# Patient Record
Sex: Male | Born: 1960 | Race: White | Hispanic: No | Marital: Married | Smoking: Never smoker
Health system: Southern US, Community
[De-identification: ages and names within clinical notes are randomized; demographics above are authoritative.]

## PROBLEM LIST (undated history)

## (undated) DIAGNOSIS — N400 Enlarged prostate without lower urinary tract symptoms: Secondary | ICD-10-CM

## (undated) DIAGNOSIS — K219 Gastro-esophageal reflux disease without esophagitis: Secondary | ICD-10-CM

## (undated) DIAGNOSIS — M1990 Unspecified osteoarthritis, unspecified site: Secondary | ICD-10-CM

## (undated) DIAGNOSIS — K449 Diaphragmatic hernia without obstruction or gangrene: Secondary | ICD-10-CM

## (undated) DIAGNOSIS — R5383 Other fatigue: Secondary | ICD-10-CM

## (undated) DIAGNOSIS — M2550 Pain in unspecified joint: Secondary | ICD-10-CM

## (undated) DIAGNOSIS — K429 Umbilical hernia without obstruction or gangrene: Secondary | ICD-10-CM

## (undated) DIAGNOSIS — F419 Anxiety disorder, unspecified: Secondary | ICD-10-CM

## (undated) DIAGNOSIS — K581 Irritable bowel syndrome with constipation: Secondary | ICD-10-CM

## (undated) DIAGNOSIS — R200 Anesthesia of skin: Secondary | ICD-10-CM

## (undated) HISTORY — DX: Other fatigue: R53.83

## (undated) HISTORY — DX: Umbilical hernia without obstruction or gangrene: K42.9

## (undated) HISTORY — DX: Diaphragmatic hernia without obstruction or gangrene: K44.9

## (undated) HISTORY — DX: Anxiety disorder, unspecified: F41.9

## (undated) HISTORY — DX: Irritable bowel syndrome with constipation: K58.1

## (undated) HISTORY — PX: UPPER GI ENDOSCOPY: SHX6162

## (undated) HISTORY — DX: Benign prostatic hyperplasia without lower urinary tract symptoms: N40.0

## (undated) HISTORY — DX: Anesthesia of skin: R20.0

## (undated) HISTORY — DX: Gastro-esophageal reflux disease without esophagitis: K21.9

## (undated) HISTORY — DX: Pain in unspecified joint: M25.50

## (undated) HISTORY — PX: TONSILLECTOMY: SUR1361

## (undated) HISTORY — DX: Unspecified osteoarthritis, unspecified site: M19.90

---

## 2005-12-11 ENCOUNTER — Inpatient Hospital Stay: Payer: Self-pay | Admitting: Internal Medicine

## 2005-12-11 ENCOUNTER — Other Ambulatory Visit: Payer: Self-pay

## 2006-10-24 DIAGNOSIS — N400 Enlarged prostate without lower urinary tract symptoms: Secondary | ICD-10-CM | POA: Insufficient documentation

## 2006-10-24 HISTORY — DX: Benign prostatic hyperplasia without lower urinary tract symptoms: N40.0

## 2008-12-22 ENCOUNTER — Ambulatory Visit: Payer: Self-pay | Admitting: Family Medicine

## 2009-04-13 ENCOUNTER — Ambulatory Visit: Payer: Self-pay | Admitting: Specialist

## 2009-09-02 HISTORY — PX: COLONOSCOPY: SHX174

## 2010-04-26 ENCOUNTER — Encounter: Payer: Self-pay | Admitting: Endocrinology

## 2010-06-11 ENCOUNTER — Ambulatory Visit: Payer: Self-pay | Admitting: Gastroenterology

## 2010-06-11 LAB — HM COLONOSCOPY: HM Colonoscopy: NORMAL

## 2010-07-06 ENCOUNTER — Ambulatory Visit: Payer: Self-pay | Admitting: Endocrinology

## 2010-07-06 DIAGNOSIS — E785 Hyperlipidemia, unspecified: Secondary | ICD-10-CM | POA: Insufficient documentation

## 2010-07-06 DIAGNOSIS — K219 Gastro-esophageal reflux disease without esophagitis: Secondary | ICD-10-CM

## 2010-07-06 DIAGNOSIS — E039 Hypothyroidism, unspecified: Secondary | ICD-10-CM | POA: Insufficient documentation

## 2010-07-06 HISTORY — DX: Gastro-esophageal reflux disease without esophagitis: K21.9

## 2010-10-03 NOTE — Assessment & Plan Note (Signed)
Summary: NEW ENDO CONSULT/ SELF REFERRAL/[employer]/ ELEV TSH/BRINGING RECO...   Vital Signs:  Patient profile:   50 year old male Height:      70 inches (177.80 cm) Weight:      173.38 pounds (78.81 kg) BMI:     24.97 O2 Sat:      98 % on Room air Temp:     98.9 degrees F (37.17 degrees C) oral Pulse rate:   91 / minute BP sitting:   122 / 68  (left arm) Cuff size:   regular  Vitals Entered By: Brenton Grills CMA Duncan Dull) (July 06, 2010 8:39 AM)  O2 Flow:  Room air CC: New Endo/Elevated TSH Is Patient Diabetic? No   Primary Kailan Laws:  Julieanne Manson MD  CC:  New Endo/Elevated TSH.  History of Present Illness: pt states 6 mos of mild dysphagia in the anterior neck, and assoc slight neck pain.    Current Medications (verified): 1)  Garlic 1000 Mg Caps (Garlic) .Marland Kitchen.. 1 By Mouth Once Daily 2)  Magnesium 200 Mg Tabs (Magnesium) .Marland Kitchen.. 1 By Mouth Once Daily 3)  Zinc 30 Mg Caps (Zinc) .... 2 By Mouth Once Daily 4)  Niacinamide 500 Mg Tabs (Niacinamide) .... 750 Mg Daily 5)  Vitamin D3 2400 Unit/ml Liqd (Cholecalciferol) .... 2400 International Units Daily 6)  Niacin 100 Mg Tabs (Niacin) .Marland Kitchen.. 1 By Mouth Once Daily  Allergies (verified): No Known Drug Allergies  Past History:  Past Medical History: HYPOTHYROIDISM (ICD-244.9) DYSLIPIDEMIA (ICD-272.4) GERD (ICD-530.81)  Past Surgical History: Tonsillectomy(1967)  Family History: Reviewed history and no changes required. mother has thyroid surgery, for uncertain reason.   mother also had sle  Social History: Reviewed history and no changes required. 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  Alcohol Use - no Regular Exercise - yes Smoking Status:  never Does Patient Exercise:  yes  Review of Systems  The patient denies fever.         denies depression, hair loss, cramps, sob, weight gain, blurry vision, myalgias, rash, syncope.  he reports constipation, and fatigue.  he attributes upper back pain to moving  furnitutre a few mos ago.  he has pruritis of the right eac.  he has slight numbness of the both hands, which he attributes to cts.  he has mild memory loss.     Physical Exam  General:  normal appearance.   Head:  head: no deformity eyes: no periorbital swelling, no proptosis external nose and ears are normal mouth: no lesion seen Ears:  right eac: slight dry skin Neck:  Supple without thyroid enlargement or tenderness.  Chest Wall:  nontender Lungs:  Clear to auscultation bilaterally. Normal respiratory effort.  Heart:  Regular rate and rhythm without murmurs or gallops noted. Normal S1,S2.   Abdomen:  abdomen is soft, nontender.  no hepatosplenomegaly.   not distended.  no hernia  Msk:  muscle bulk and strength are grossly normal.  no obvious joint swelling.  gait is normal and steady  Pulses:  radial are intact bilat Extremities:  no deformity Neurologic:  cn 2-12 grossly intact.   readily moves all 4's. sensation is intact to touch on all 4's Skin:  normal texture and temp.  no rash is seen.  not diaphoretic  Cervical Nodes:  No significant adenopathy.  Psych:  Alert and cooperative; normal mood and affect; normal attention span and concentration.   Additional Exam:  outside test results are reviewed:  tsh=4.89 (pt is unaware of having had any tsh  in the past)   Impression & Recommendations:  Problem # 1:  HYPOTHYROIDISM (ICD-244.9) Assessment New mild  Problem # 2:  dysphagia mild not thyroid-related  Problem # 3:  pruritis of the eac  Problem # 4:  upper back pain  Medications Added to Medication List This Visit: 1)  Garlic 1000 Mg Caps (Garlic) .Marland Kitchen.. 1 by mouth once daily 2)  Magnesium 200 Mg Tabs (Magnesium) .Marland Kitchen.. 1 by mouth once daily 3)  Zinc 30 Mg Caps (Zinc) .... 2 by mouth once daily 4)  Niacinamide 500 Mg Tabs (Niacinamide) .... 750 mg daily 5)  Vitamin D3 2400 Unit/ml Liqd (Cholecalciferol) .... 2400 international units daily 6)  Niacin 100 Mg Tabs  (Niacin) .Marland Kitchen.. 1 by mouth once daily  Other Orders: New Patient Level IV (29528)  Patient Instructions: 1)  cc dr Sullivan Lone 2)  apply hydrocortisone cream to the right ear canal as needed for itching. 3)  i am happy to order a chest x ray if you wish.   4)  i am also happy to prescribe a small amount of synthroid. 5)  if not, i would advise repeating the tsh in the spring.  you could also  have the thyroid antibodies checked, but they are likely to be positive.   6)  return here as needed.   Orders Added: 1)  New Patient Level IV [41324]    Preventive Care Screening  Last Tetanus Booster:    Date:  09/03/2003    Results:  Historical

## 2013-01-19 LAB — HM HEPATITIS C SCREENING LAB: HM HEPATITIS C SCREENING: NEGATIVE

## 2014-01-20 LAB — LIPID PANEL
CHOLESTEROL: 209 mg/dL — AB (ref 0–200)
HDL: 48 mg/dL (ref 35–70)
LDL Cholesterol: 137 mg/dL
LDL/HDL RATIO: 2.9
Triglycerides: 120 mg/dL (ref 40–160)

## 2014-01-20 LAB — CBC AND DIFFERENTIAL
HEMATOCRIT: 46 % (ref 41–53)
Hemoglobin: 15.6 g/dL (ref 13.5–17.5)
NEUTROS ABS: 60 /uL
PLATELETS: 223 10*3/uL (ref 150–399)
WBC: 5.7 10^3/mL

## 2014-01-20 LAB — HEPATIC FUNCTION PANEL
ALT: 27 U/L (ref 10–40)
AST: 24 U/L (ref 14–40)
Alkaline Phosphatase: 46 U/L (ref 25–125)
Bilirubin, Total: 1 mg/dL

## 2014-01-20 LAB — PSA: PSA: 0.7

## 2014-01-20 LAB — BASIC METABOLIC PANEL
BUN: 11 mg/dL (ref 4–21)
Creatinine: 1.1 mg/dL (ref <=1.3)
GLUCOSE: 98 mg/dL
Potassium: 5.6 mmol/L — AB (ref 3.4–5.3)
SODIUM: 140 mmol/L (ref 137–147)

## 2014-01-20 LAB — TSH: TSH: 3.92 u[IU]/mL (ref <=5.90)

## 2015-01-04 DIAGNOSIS — K581 Irritable bowel syndrome with constipation: Secondary | ICD-10-CM | POA: Insufficient documentation

## 2015-01-04 HISTORY — DX: Irritable bowel syndrome with constipation: K58.1

## 2015-02-16 ENCOUNTER — Encounter: Payer: Self-pay | Admitting: Family Medicine

## 2015-02-16 ENCOUNTER — Ambulatory Visit (INDEPENDENT_AMBULATORY_CARE_PROVIDER_SITE_OTHER): Payer: Managed Care, Other (non HMO) | Admitting: Family Medicine

## 2015-02-16 VITALS — BP 116/68 | HR 72 | Temp 98.2°F | Resp 16 | Ht 70.0 in | Wt 192.0 lb

## 2015-02-16 DIAGNOSIS — Z0000 Encounter for general adult medical examination without abnormal findings: Secondary | ICD-10-CM

## 2015-02-16 LAB — POCT URINALYSIS DIPSTICK
BILIRUBIN UA: NEGATIVE
Blood, UA: NEGATIVE
GLUCOSE UA: NEGATIVE
Ketones, UA: NEGATIVE
Leukocytes, UA: NEGATIVE
NITRITE UA: NEGATIVE
Protein, UA: NEGATIVE
SPEC GRAV UA: 1.015
UROBILINOGEN UA: 0.2
pH, UA: 6

## 2015-02-16 LAB — IFOBT (OCCULT BLOOD): IFOBT: NEGATIVE

## 2015-02-16 NOTE — Progress Notes (Signed)
Patient: Christopher Shannon, Male    DOB: 12-Feb-1961, 54 y.o.   MRN: [id number] Visit Date: 02/16/2015  Today's Provider: Wilhemena Durie, MD   Chief Complaint  Patient presents with  . Annual Exam   Subjective:   Christopher Shannon is a 54 y.o. male who presents today for  His Annual Wellness Visit. He feels well. He reports exercising once a week, he plays tennis and mows the yard. He reports he is sleeping well.  Review of Systems  Constitutional: Negative.   HENT: Negative.   Eyes: Negative.   Respiratory: Negative.   Cardiovascular: Negative.   Gastrointestinal: Positive for abdominal pain (and bloating).  Endocrine: Negative.   Genitourinary: Negative.   Musculoskeletal: Positive for back pain.  Skin: Negative.   Allergic/Immunologic: Negative.  Negative for immunocompromised state.  Hematological: Negative.   Psychiatric/Behavioral: Negative.     Patient Active Problem List   Diagnosis Date Noted  . Irritable bowel syndrome with constipation 01/04/2015  . HYPOTHYROIDISM 07/06/2010  . DYSLIPIDEMIA 07/06/2010  . GERD 07/06/2010  . Pruritic disorder 07/22/2007  . Benign prostatic hypertrophy without urinary obstruction 10/24/2006    History   Social History  . Marital Status: Married    Spouse Name: N/A  . Number of Children: N/A  . Years of Education: N/A   Occupational History  . Not on file.   Social History Main Topics  . Smoking status: Not on file  . Smokeless tobacco: Not on file  . Alcohol Use: Not on file  . Drug Use: Not on file  . Sexual Activity: Not on file   Other Topics Concern  . Not on file   Social History Narrative  . No narrative on file    No past surgical history on file.  His family history includes Heart disease in his father; Lupus in his mother; Stomach cancer in his mother; Thyroid disease in his mother.    No outpatient prescriptions prior to visit.   No facility-administered medications prior to visit.     Allergies no known allergies  Patient Care Team: Jerrol Banana., MD as PCP - General (Family Medicine)  Objective:   Vitals:  Filed Vitals:   02/16/15 0915  BP: 116/68  Pulse: 72  Temp: 98.2 F (36.8 C)  Resp: 16   Physical Exam  Constitutional: He appears well-developed and well-nourished.  HENT:  Head: Normocephalic and atraumatic.  Right Ear: Hearing, tympanic membrane, external ear and ear canal normal.  Left Ear: Hearing, tympanic membrane and external ear normal.  Nose: Nose normal.  Mouth/Throat: Oropharynx is clear and moist.  Eyes: EOM are normal. Pupils are equal, round, and reactive to light.  Neck: Normal range of motion. Neck supple.  Cardiovascular: Normal rate, regular rhythm, normal heart sounds and intact distal pulses.   Pulmonary/Chest: Effort normal and breath sounds normal.  Abdominal: Soft. Normal aorta and bowel sounds are normal.  Genitourinary: Rectum normal, prostate normal and penis normal.  Musculoskeletal: Normal range of motion.  Neurological: He is alert. He has normal strength and normal reflexes. No cranial nerve deficit or sensory deficit.  Skin: Skin is warm and dry.  Psychiatric: He has a normal mood and affect. His behavior is normal. Judgment and thought content normal.       Assessment & Plan:   Health maintenance-complete physical exam-within normal limits. We'll obtain  routine labs including PSA today. As explained to the patient that this will probably last PSA as this is no longer  recommended his routine practice. Return to clinic 1 year Exercise Activities and Dietary recommendations Goals    None      Immunization History  Administered Date(s) Administered  . Td 09/03/2003    Health Maintenance  Topic Date Due  . HIV Screening  11/16/1974  . TETANUS/TDAP  09/02/2013  . INFLUENZA VACCINE  04/03/2015  . COLONOSCOPY  06/11/2020      Discussed health benefits of physical activity, and encouraged him  to engage in regular exercise appropriate for his age and condition.    Miguel Aschoff MD [facility] 02/16/2015 9:07 AM  ------------------------------------------------------------------------------------------------------------

## 2015-02-17 LAB — COMPREHENSIVE METABOLIC PANEL
ALBUMIN: 4.6 g/dL (ref 3.5–5.5)
ALK PHOS: 52 IU/L (ref 39–117)
ALT: 23 IU/L (ref 0–44)
AST: 14 IU/L (ref 0–40)
Albumin/Globulin Ratio: 2.3 (ref 1.1–2.5)
BILIRUBIN TOTAL: 0.9 mg/dL (ref 0.0–1.2)
BUN/Creatinine Ratio: 9 (ref 9–20)
BUN: 11 mg/dL (ref 6–24)
CHLORIDE: 98 mmol/L (ref 97–108)
CO2: 27 mmol/L (ref 18–29)
Calcium: 9.4 mg/dL (ref 8.7–10.2)
Creatinine, Ser: 1.17 mg/dL (ref 0.76–1.27)
GFR calc Af Amer: 81 mL/min/{1.73_m2} (ref >59)
GFR calc non Af Amer: 70 mL/min/{1.73_m2} (ref >59)
GLOBULIN, TOTAL: 2 g/dL (ref 1.5–4.5)
Glucose: 96 mg/dL (ref 65–99)
POTASSIUM: 4.5 mmol/L (ref 3.5–5.2)
Sodium: 138 mmol/L (ref 134–144)
Total Protein: 6.6 g/dL (ref 6.0–8.5)

## 2015-02-17 LAB — CBC WITH DIFFERENTIAL/PLATELET
BASOS ABS: 0 10*3/uL (ref 0.0–0.2)
BASOS: 0 %
EOS (ABSOLUTE): 0.2 10*3/uL (ref 0.0–0.4)
EOS: 3 %
HEMOGLOBIN: 16 g/dL (ref 12.6–17.7)
Hematocrit: 47.7 % (ref 37.5–51.0)
Immature Grans (Abs): 0 10*3/uL (ref 0.0–0.1)
Immature Granulocytes: 0 %
LYMPHS ABS: 1.6 10*3/uL (ref 0.7–3.1)
Lymphs: 25 %
MCH: 28.4 pg (ref 26.6–33.0)
MCHC: 33.5 g/dL (ref 31.5–35.7)
MCV: 85 fL (ref 79–97)
MONOCYTES: 8 %
Monocytes Absolute: 0.5 10*3/uL (ref 0.1–0.9)
NEUTROS ABS: 4.2 10*3/uL (ref 1.4–7.0)
Neutrophils: 64 %
Platelets: 216 10*3/uL (ref 150–379)
RBC: 5.64 x10E6/uL (ref 4.14–5.80)
RDW: 14.3 % (ref 12.3–15.4)
WBC: 6.5 10*3/uL (ref 3.4–10.8)

## 2015-02-17 LAB — LIPID PANEL WITH LDL/HDL RATIO
Cholesterol, Total: 219 mg/dL — ABNORMAL HIGH (ref 100–199)
HDL: 104 mg/dL (ref >39)
LDL CALC: 83 mg/dL (ref 0–99)
LDL/HDL RATIO: 0.8 ratio (ref 0.0–3.6)
TRIGLYCERIDES: 162 mg/dL — AB (ref 0–149)
VLDL CHOLESTEROL CAL: 32 mg/dL (ref 5–40)

## 2015-02-17 LAB — TSH: TSH: 4.97 u[IU]/mL — ABNORMAL HIGH (ref 0.450–4.500)

## 2015-02-17 LAB — PSA: Prostate Specific Ag, Serum: 0.8 ng/mL (ref 0.0–4.0)

## 2015-02-20 ENCOUNTER — Telehealth: Payer: Self-pay

## 2015-02-20 NOTE — Telephone Encounter (Signed)
-----   Message from Jerrol Banana., MD sent at 02/19/2015  5:45 PM EDT ----- Stable. Less rolled slightly high. D&E. We'll recheck thyroid in his next visit. Him back in 3-6 months for the thyroid recheck as I think otherwise his next visit will be a year

## 2015-02-20 NOTE — Telephone Encounter (Signed)
LMT[facility] ED 

## 2015-10-17 ENCOUNTER — Ambulatory Visit (INDEPENDENT_AMBULATORY_CARE_PROVIDER_SITE_OTHER): Payer: Managed Care, Other (non HMO) | Admitting: Family Medicine

## 2015-10-17 ENCOUNTER — Encounter: Payer: Self-pay | Admitting: Family Medicine

## 2015-10-17 VITALS — BP 118/78 | HR 87 | Temp 98.6°F | Resp 16 | Wt 192.8 lb

## 2015-10-17 DIAGNOSIS — J069 Acute upper respiratory infection, unspecified: Secondary | ICD-10-CM | POA: Diagnosis not present

## 2015-10-17 DIAGNOSIS — B9789 Other viral agents as the cause of diseases classified elsewhere: Principal | ICD-10-CM

## 2015-10-17 MED ORDER — HYDROCODONE-HOMATROPINE 5-1.5 MG/5ML PO SYRP: 5.0000 mL | ORAL_SOLUTION | Freq: Three times a day (TID) | ORAL | Status: DC | PRN

## 2015-10-17 NOTE — Progress Notes (Signed)
Patient ID: Christopher Shannon, male   DOB: 11-17-1960, 55 y.o.   MRN: [id number]   Patient: Christopher Shannon Male    DOB: 1961-02-21   55 y.o.   MRN: [id number] Visit Date: 10/17/2015  Today's Provider: Vernie Murders, PA   Chief Complaint  Patient presents with  . URI   Subjective:    URI  This is a new problem. Episode onset: 3 days ago. The problem has been unchanged. There has been no fever. Associated symptoms include congestion, coughing, headaches, rhinorrhea, sneezing and a sore throat. Pertinent negatives include no nausea. Associated symptoms comments: Watery eyes and sore throat with cough.. He has tried decongestant (Dayquil, Robitussin and Mucinex) for the symptoms. The treatment provided no relief.   Patient Active Problem List   Diagnosis Date Noted  . Irritable bowel syndrome with constipation 01/04/2015  . HYPOTHYROIDISM 07/06/2010  . DYSLIPIDEMIA 07/06/2010  . GERD 07/06/2010  . Pruritic disorder 07/22/2007  . Benign prostatic hypertrophy without urinary obstruction 10/24/2006   History reviewed. No pertinent past surgical history. Family History  Problem Relation Age of Onset  . Lupus Mother   . Stomach cancer Mother   . Thyroid disease Mother   . Heart disease Father      Previous Medications   No medications on file   No Known Allergies  Review of Systems  Constitutional: Negative.   HENT: Positive for congestion, rhinorrhea, sneezing and sore throat.   Eyes: Negative.   Respiratory: Positive for cough.   Cardiovascular: Negative.   Gastrointestinal: Negative.  Negative for nausea.  Endocrine: Negative.   Genitourinary: Negative.   Musculoskeletal: Negative.   Skin: Negative.   Allergic/Immunologic: Negative.   Neurological: Positive for headaches.  Hematological: Negative.   Psychiatric/Behavioral: Negative.     Social History  Substance Use Topics  . Smoking status: Never Smoker   . Smokeless tobacco: Not on file  . Alcohol Use:  No   Objective:   BP 118/78 mmHg  Pulse 87  Temp(Src) 98.6 F (37 C) (Oral)  Resp 16  Wt 192 lb 12.8 oz (87.454 kg)  SpO2 98%  Physical Exam  Constitutional: He is oriented to person, place, and time. He appears well-developed and well-nourished. No distress.  HENT:  Head: Normocephalic and atraumatic.  Right Ear: Hearing and external ear normal.  Left Ear: Hearing and external ear normal.  Nose: Nose normal.  Mouth/Throat: Oropharynx is clear and moist.  Eyes: Conjunctivae, EOM and lids are normal. Right eye exhibits no discharge. Left eye exhibits no discharge. No scleral icterus.  Neck: Normal range of motion. Neck supple.  Cardiovascular: Normal rate and regular rhythm.   Pulmonary/Chest: Effort normal and breath sounds normal. No respiratory distress. He has no wheezes. He has no rales.  Abdominal: Soft. Bowel sounds are normal.  Musculoskeletal: Normal range of motion.  Lymphadenopathy:    He has no cervical adenopathy.  Neurological: He is alert and oriented to person, place, and time.  Skin: Skin is intact. No lesion and no rash noted.  Psychiatric: He has a normal mood and affect. His speech is normal and behavior is normal. Thought content normal.      Assessment & Plan:     1. Viral URI with cough Onset 3 days ago with nasal congestion, rhinorrhea, sneezing and headache. Not much relief from OTC cough meds. Given Hycodan to control ticklish cough and recommend increase in fluid intake. May use Advil or Tylenol prn aches or pains. Denies fever at home. Will  treat with Hycodan syrup and recheck if no better in 5-7 days. - HYDROcodone-homatropine (HYCODAN) 5-1.5 MG/5ML syrup; Take 5 mLs by mouth every 8 (eight) hours as needed for cough.  Dispense: 120 mL; Refill: 0

## 2015-10-17 NOTE — Patient Instructions (Signed)
Upper Respiratory Infection, Adult Most upper respiratory infections (URIs) are a viral infection of the air passages leading to the lungs. A URI affects the nose, throat, and upper air passages. The most common type of URI is nasopharyngitis and is typically referred to as "the common cold." URIs run their course and usually go away on their own. Most of the time, a URI does not require medical attention, but sometimes a bacterial infection in the upper airways can follow a viral infection. This is called a secondary infection. Sinus and middle ear infections are common types of secondary upper respiratory infections. Bacterial pneumonia can also complicate a URI. A URI can worsen asthma and chronic obstructive pulmonary disease (COPD). Sometimes, these complications can require emergency medical care and may be life threatening.  CAUSES Almost all URIs are caused by viruses. A virus is a type of germ and can spread from one person to another.  RISKS FACTORS You may be at risk for a URI if:   You smoke.   You have chronic heart or lung disease.  You have a weakened defense (immune) system.   You are very young or very old.   You have nasal allergies or asthma.  You work in crowded or poorly ventilated areas.  You work in health care facilities or schools. SIGNS AND SYMPTOMS  Symptoms typically develop 2-3 days after you come in contact with a cold virus. Most viral URIs last 7-10 days. However, viral URIs from the influenza virus (flu virus) can last 14-18 days and are typically more severe. Symptoms may include:   Runny or stuffy (congested) nose.   Sneezing.   Cough.   Sore throat.   Headache.   Fatigue.   Fever.   Loss of appetite.   Pain in your forehead, behind your eyes, and over your cheekbones (sinus pain).  Muscle aches.  DIAGNOSIS  Your health care provider may diagnose a URI by:  Physical exam.  Tests to check that your symptoms are not due to  another condition such as:  Strep throat.  Sinusitis.  Pneumonia.  Asthma. TREATMENT  A URI goes away on its own with time. It cannot be cured with medicines, but medicines may be prescribed or recommended to relieve symptoms. Medicines may help:  Reduce your fever.  Reduce your cough.  Relieve nasal congestion. HOME CARE INSTRUCTIONS   Take medicines only as directed by your health care provider.   Gargle warm saltwater or take cough drops to comfort your throat as directed by your health care provider.  Use a warm mist humidifier or inhale steam from a shower to increase air moisture. This may make it easier to breathe.  Drink enough fluid to keep your urine clear or pale yellow.   Eat soups and other clear broths and maintain good nutrition.   Rest as needed.   Return to work when your temperature has returned to normal or as your health care provider advises. You may need to stay home longer to avoid infecting others. You can also use a face mask and careful hand washing to prevent spread of the virus.  Increase the usage of your inhaler if you have asthma.   Do not use any tobacco products, including cigarettes, chewing tobacco, or electronic cigarettes. If you need help quitting, ask your health care provider. PREVENTION  The best way to protect yourself from getting a cold is to practice good hygiene.   Avoid oral or hand contact with people with cold   symptoms.   Wash your hands often if contact occurs.  There is no clear evidence that vitamin C, vitamin E, echinacea, or exercise reduces the chance of developing a cold. However, it is always recommended to get plenty of rest, exercise, and practice good nutrition.  SEEK MEDICAL CARE IF:   You are getting worse rather than better.   Your symptoms are not controlled by medicine.   You have chills.  You have worsening shortness of breath.  You have brown or red mucus.  You have yellow or brown nasal  discharge.  You have pain in your face, especially when you bend forward.  You have a fever.  You have swollen neck glands.  You have pain while swallowing.  You have white areas in the back of your throat. SEEK IMMEDIATE MEDICAL CARE IF:   You have severe or persistent:  Headache.  Ear pain.  Sinus pain.  Chest pain.  You have chronic lung disease and any of the following:  Wheezing.  Prolonged cough.  Coughing up blood.  A change in your usual mucus.  You have a stiff neck.  You have changes in your:  Vision.  Hearing.  Thinking.  Mood. MAKE SURE YOU:   Understand these instructions.  Will watch your condition.  Will get help right away if you are not doing well or get worse.   This information is not intended to replace advice given to you by your health care provider. Make sure you discuss any questions you have with your health care provider.   Document Released: 02/12/2001 Document Revised: 01/03/2015 Document Reviewed: 11/24/2013 Elsevier Interactive Patient Education 2016 Elsevier Inc.  

## 2016-04-01 ENCOUNTER — Encounter: Payer: Self-pay | Admitting: Family Medicine

## 2016-04-01 ENCOUNTER — Ambulatory Visit (INDEPENDENT_AMBULATORY_CARE_PROVIDER_SITE_OTHER): Payer: Managed Care, Other (non HMO) | Admitting: Family Medicine

## 2016-04-01 VITALS — BP 106/74 | HR 78 | Temp 98.5°F | Resp 12 | Ht 70.75 in | Wt 190.0 lb

## 2016-04-01 DIAGNOSIS — Z1211 Encounter for screening for malignant neoplasm of colon: Secondary | ICD-10-CM

## 2016-04-01 DIAGNOSIS — Z0000 Encounter for general adult medical examination without abnormal findings: Secondary | ICD-10-CM | POA: Diagnosis not present

## 2016-04-01 DIAGNOSIS — M79606 Pain in leg, unspecified: Secondary | ICD-10-CM

## 2016-04-01 DIAGNOSIS — K429 Umbilical hernia without obstruction or gangrene: Secondary | ICD-10-CM | POA: Diagnosis not present

## 2016-04-01 DIAGNOSIS — Z125 Encounter for screening for malignant neoplasm of prostate: Secondary | ICD-10-CM

## 2016-04-01 LAB — POCT URINALYSIS DIPSTICK
BILIRUBIN UA: NEGATIVE
Blood, UA: NEGATIVE
Glucose, UA: NEGATIVE
KETONES UA: NEGATIVE
Leukocytes, UA: NEGATIVE
Nitrite, UA: NEGATIVE
PH UA: 6.5
PROTEIN UA: NEGATIVE
SPEC GRAV UA: 1.02
Urobilinogen, UA: 0.2

## 2016-04-01 LAB — IFOBT (OCCULT BLOOD): IMMUNOLOGICAL FECAL OCCULT BLOOD TEST: NEGATIVE

## 2016-04-01 NOTE — Progress Notes (Signed)
Patient: Christopher Shannon, Male    DOB: September 11, 1960, 55 y.o.   MRN: [id number] Visit Date: 04/01/2016  Today's Provider: Wilhemena Durie, MD   Chief Complaint  Patient presents with  . Annual Exam   Subjective:  Christopher Shannon is a 55 y.o. male who presents today for health maintenance and complete physical. He feels well. He reports exercising not much just plays tennis once a week. He reports he is sleeping well.  Last colonoscopy was 06/11/10-normal repeat 10 years Review of Systems  Constitutional: Positive for fatigue.  HENT: Negative.   Eyes: Negative.   Respiratory: Negative.   Cardiovascular: Negative.   Gastrointestinal: Positive for constipation.  Endocrine: Negative.   Genitourinary: Negative.   Musculoskeletal: Positive for arthralgias (leg pain).       At times his legs "ache". He cannot be more specific than that. It is not claudication. The discomfort actually gets better with activity.  Skin: Negative.   Allergic/Immunologic: Negative.   Neurological: Negative.   Hematological: Negative.   Psychiatric/Behavioral: Negative.     Social History   Social History  . Marital status: Married    Spouse name: N/A  . Number of children: N/A  . Years of education: N/A   Occupational History  . Not on file.   Social History Main Topics  . Smoking status: Never Smoker  . Smokeless tobacco: Never Used  . Alcohol use No  . Drug use: No  . Sexual activity: Not on file   Other Topics Concern  . Not on file   Social History Narrative  . No narrative on file    Patient Active Problem List   Diagnosis Date Noted  . Irritable bowel syndrome with constipation 01/04/2015  . HYPOTHYROIDISM 07/06/2010  . DYSLIPIDEMIA 07/06/2010  . GERD 07/06/2010  . Pruritic disorder 07/22/2007  . Benign prostatic hypertrophy without urinary obstruction 10/24/2006    Past Surgical History:  Procedure Laterality Date  . NO PAST SURGERIES      His family history  includes Asthma in his son; Heart disease in his father; Lupus in his mother; Stomach cancer in his mother; Thyroid disease in his mother.    Outpatient Medications Prior to Visit  Medication Sig Dispense Refill  . HYDROcodone-homatropine (HYCODAN) 5-1.5 MG/5ML syrup Take 5 mLs by mouth every 8 (eight) hours as needed for cough. 120 mL 0   No facility-administered medications prior to visit.     Patient Care Team: Jerrol Banana., MD as PCP - General (Family Medicine)     Objective:   Vitals:  Vitals:   04/01/16 0930  BP: 106/74  Pulse: 78  Resp: 12  Temp: 98.5 F (36.9 C)  Weight: 190 lb (86.2 kg)  Height: 5' 10.75" (1.797 m)    Physical Exam  Constitutional: He is oriented to person, place, and time. He appears well-developed and well-nourished.  HENT:  Head: Normocephalic and atraumatic.  Right Ear: External ear normal.  Left Ear: External ear normal.  Mouth/Throat: Oropharynx is clear and moist.  Eyes: Conjunctivae are normal. Pupils are equal, round, and reactive to light.  Neck: Normal range of motion. Neck supple.  Cardiovascular: Normal rate, regular rhythm, normal heart sounds and intact distal pulses.   No murmur heard. Pulmonary/Chest: Effort normal and breath sounds normal. No respiratory distress. He has no wheezes.  Abdominal: Soft. He exhibits no distension. There is no tenderness. There is no rebound. A hernia (umbilical) is present.  Small easily reducible umbilical hernia noted  Genitourinary: Rectum normal, prostate normal and penis normal. Rectal exam shows guaiac negative stool. No penile tenderness.  Musculoskeletal: Normal range of motion. He exhibits no edema.  Neurological: He is alert and oriented to person, place, and time. No cranial nerve deficit.  Skin: No rash noted. No erythema.  Psychiatric: He has a normal mood and affect. His behavior is normal. Judgment and thought content normal.     Depression Screen PHQ 2/9 Scores  04/01/2016  PHQ - 2 Score 0      Assessment & Plan:    1. Annual physical exam - CBC w/Diff/Platelet - Comprehensive metabolic panel - Lipid Panel With LDL/HDL Ratio - POCT urinalysis dipstick - TSH  2. Prostate cancer screening - PSA  3. Colon cancer screening - IFOBT POC (occult bld, rslt in office)  4. Pain of lower extremity, unspecified laterality - CK (Creatine Kinase) - Sed Rate (ESR)  5. Umbilical hernia without obstruction and without gangrene - Ambulatory referral to General Surgery   I have done the exam and reviewed the above chart and it is accurate to the best of my knowledge.  Patient was seen and examined by Dr. Eulas Post and note was scribed by Theressa Millard, RMA.

## 2016-04-02 LAB — COMPREHENSIVE METABOLIC PANEL
ALT: 21 IU/L (ref 0–44)
AST: 18 IU/L (ref 0–40)
Albumin/Globulin Ratio: 1.6 (ref 1.2–2.2)
Albumin: 4.1 g/dL (ref 3.5–5.5)
Alkaline Phosphatase: 61 IU/L (ref 39–117)
BUN/Creatinine Ratio: 17 (ref 9–20)
BUN: 15 mg/dL (ref 6–24)
Bilirubin Total: 0.7 mg/dL (ref 0.0–1.2)
CALCIUM: 9.1 mg/dL (ref 8.7–10.2)
CO2: 27 mmol/L (ref 18–29)
Chloride: 100 mmol/L (ref 96–106)
Creatinine, Ser: 0.89 mg/dL (ref 0.76–1.27)
GFR, EST AFRICAN AMERICAN: 110 mL/min/{1.73_m2} (ref >59)
GFR, EST NON AFRICAN AMERICAN: 96 mL/min/{1.73_m2} (ref >59)
GLUCOSE: 100 mg/dL — AB (ref 65–99)
Globulin, Total: 2.5 g/dL (ref 1.5–4.5)
Potassium: 4.5 mmol/L (ref 3.5–5.2)
Sodium: 140 mmol/L (ref 134–144)
TOTAL PROTEIN: 6.6 g/dL (ref 6.0–8.5)

## 2016-04-02 LAB — CBC WITH DIFFERENTIAL/PLATELET
BASOS ABS: 0 10*3/uL (ref 0.0–0.2)
BASOS: 0 %
EOS (ABSOLUTE): 0.1 10*3/uL (ref 0.0–0.4)
Eos: 2 %
Hematocrit: 43.7 % (ref 37.5–51.0)
Hemoglobin: 14.6 g/dL (ref 12.6–17.7)
IMMATURE GRANS (ABS): 0 10*3/uL (ref 0.0–0.1)
IMMATURE GRANULOCYTES: 0 %
LYMPHS: 33 %
Lymphocytes Absolute: 2.4 10*3/uL (ref 0.7–3.1)
MCH: 27.8 pg (ref 26.6–33.0)
MCHC: 33.4 g/dL (ref 31.5–35.7)
MCV: 83 fL (ref 79–97)
Monocytes Absolute: 0.5 10*3/uL (ref 0.1–0.9)
Monocytes: 7 %
NEUTROS PCT: 58 %
Neutrophils Absolute: 4.2 10*3/uL (ref 1.4–7.0)
PLATELETS: 238 10*3/uL (ref 150–379)
RBC: 5.25 x10E6/uL (ref 4.14–5.80)
RDW: 14.1 % (ref 12.3–15.4)
WBC: 7.3 10*3/uL (ref 3.4–10.8)

## 2016-04-02 LAB — PSA: Prostate Specific Ag, Serum: 1.2 ng/mL (ref 0.0–4.0)

## 2016-04-02 LAB — SEDIMENTATION RATE: SED RATE: 5 mm/h (ref 0–30)

## 2016-04-02 LAB — LIPID PANEL WITH LDL/HDL RATIO
Cholesterol, Total: 135 mg/dL (ref 100–199)
HDL: 39 mg/dL — AB (ref >39)
LDL Calculated: 66 mg/dL (ref 0–99)
LDL/HDL RATIO: 1.7 ratio (ref 0.0–3.6)
Triglycerides: 151 mg/dL — ABNORMAL HIGH (ref 0–149)
VLDL CHOLESTEROL CAL: 30 mg/dL (ref 5–40)

## 2016-04-02 LAB — CK: CK TOTAL: 62 U/L (ref 24–204)

## 2016-04-02 LAB — TSH: TSH: 0.017 u[IU]/mL — ABNORMAL LOW (ref 0.450–4.500)

## 2016-04-03 ENCOUNTER — Encounter: Payer: Self-pay | Admitting: *Deleted

## 2016-04-03 ENCOUNTER — Telehealth: Payer: Self-pay | Admitting: Family Medicine

## 2016-04-03 NOTE — Telephone Encounter (Signed)
Did not see TSH--hyperthyroid--would repeat in 1-2 months--if still hyper would refer to endocrine. He is not taking any Synthroid is he?

## 2016-04-03 NOTE — Telephone Encounter (Signed)
Patient called office inquiring about lab report, I advised patient of report. Patient states that he noticed on Mychart that his TSH was dramatically low and had concerns. He would like for Dr. Marlan Palau nurse to give him a call back to discuss TSH. KW

## 2016-04-03 NOTE — Telephone Encounter (Signed)
Pt is returning call.  CB#[phone]/MW

## 2016-04-04 NOTE — Telephone Encounter (Signed)
Pt advised-aa 

## 2016-04-04 NOTE — Telephone Encounter (Signed)
Pt has called again.  CB#[phone]/MW

## 2016-05-01 ENCOUNTER — Ambulatory Visit: Payer: Self-pay | Admitting: General Surgery

## 2016-05-15 ENCOUNTER — Telehealth: Payer: Self-pay | Admitting: Family Medicine

## 2016-05-15 DIAGNOSIS — E0590 Thyrotoxicosis, unspecified without thyrotoxic crisis or storm: Secondary | ICD-10-CM

## 2016-05-15 NOTE — Telephone Encounter (Signed)
Pt called saying he was supposed to call back and have an order for his TSH labs to be repeated in a month or so.  It's been 6 weeks.  Please advise  [phone]  Thank sTeri

## 2016-05-15 NOTE — Telephone Encounter (Signed)
Lab slip ready and pt advised-aa

## 2016-05-16 ENCOUNTER — Encounter: Payer: Self-pay | Admitting: *Deleted

## 2016-06-01 LAB — TSH: TSH: 8.81 u[IU]/mL — AB (ref 0.450–4.500)

## 2016-06-06 ENCOUNTER — Telehealth: Payer: Self-pay | Admitting: Family Medicine

## 2016-06-06 NOTE — Telephone Encounter (Signed)
Pt returned call to [facility].    Thanks, [employer]

## 2017-04-02 ENCOUNTER — Ambulatory Visit (INDEPENDENT_AMBULATORY_CARE_PROVIDER_SITE_OTHER): Payer: Managed Care, Other (non HMO) | Admitting: Family Medicine

## 2017-04-02 ENCOUNTER — Encounter: Payer: Self-pay | Admitting: Family Medicine

## 2017-04-02 VITALS — BP 98/62 | HR 62 | Temp 98.2°F | Resp 10 | Ht 70.5 in | Wt 191.0 lb

## 2017-04-02 DIAGNOSIS — Z125 Encounter for screening for malignant neoplasm of prostate: Secondary | ICD-10-CM | POA: Diagnosis not present

## 2017-04-02 DIAGNOSIS — Z1211 Encounter for screening for malignant neoplasm of colon: Secondary | ICD-10-CM

## 2017-04-02 DIAGNOSIS — Z0000 Encounter for general adult medical examination without abnormal findings: Secondary | ICD-10-CM | POA: Diagnosis not present

## 2017-04-02 DIAGNOSIS — K429 Umbilical hernia without obstruction or gangrene: Secondary | ICD-10-CM

## 2017-04-02 LAB — POCT URINALYSIS DIPSTICK
Bilirubin, UA: NEGATIVE
Blood, UA: NEGATIVE
Glucose, UA: NEGATIVE
Ketones, UA: NEGATIVE
LEUKOCYTES UA: NEGATIVE
Nitrite, UA: NEGATIVE
PROTEIN UA: NEGATIVE
Spec Grav, UA: 1.02 (ref 1.010–1.025)
UROBILINOGEN UA: 0.2 U/dL
pH, UA: 6 (ref 5.0–8.0)

## 2017-04-02 LAB — IFOBT (OCCULT BLOOD): IMMUNOLOGICAL FECAL OCCULT BLOOD TEST: NEGATIVE

## 2017-04-02 NOTE — Progress Notes (Signed)
Patient: Christopher Shannon, Male    DOB: 06/15/1961, 56 y.o.   MRN: [id number] Visit Date: 04/02/2017  Today's Provider: Wilhemena Durie, MD   Chief Complaint  Patient presents with  . Annual Exam   Subjective:  Christopher Shannon is a 56 y.o. male who presents today for health maintenance and complete physical. He feels fairly well. He reports exercising plays tennis a few times a week and walks a lot at work. He reports he is sleeping well.  Last colonoscopy was 06/11/10-normal, repeat in 2021. This was done by Dr Candace Cruise.  Review of Systems  Constitutional: Positive for fatigue.  HENT: Negative.   Eyes: Positive for itching.  Respiratory: Negative.   Cardiovascular: Negative.   Gastrointestinal: Positive for constipation.  Endocrine: Negative.   Genitourinary: Positive for frequency.  Musculoskeletal: Negative.   Skin: Negative.   Allergic/Immunologic: Negative.   Neurological: Negative.   Hematological: Negative.   Psychiatric/Behavioral: Negative.     Social History   Social History  . Marital status: Married    Spouse name: N/A  . Number of children: N/A  . Years of education: N/A   Occupational History  . Not on file.   Social History Main Topics  . Smoking status: Never Smoker  . Smokeless tobacco: Never Used  . Alcohol use No  . Drug use: No  . Sexual activity: Not on file   Other Topics Concern  . Not on file   Social History Narrative  . No narrative on file    Patient Active Problem List   Diagnosis Date Noted  . Irritable bowel syndrome with constipation 01/04/2015  . HYPOTHYROIDISM 07/06/2010  . DYSLIPIDEMIA 07/06/2010  . GERD 07/06/2010  . Pruritic disorder 07/22/2007  . Benign prostatic hypertrophy without urinary obstruction 10/24/2006    Past Surgical History:  Procedure Laterality Date  . NO PAST SURGERIES      His family history includes Asthma in his son; Heart disease in his father; Lupus in his mother; Stomach cancer in  his mother; Thyroid disease in his mother.     Outpatient Encounter Prescriptions as of 04/02/2017  Medication Sig  . Multiple Vitamins-Minerals (MULTIVITAMIN ADULT PO) Take by mouth daily.  Marland Kitchen co-enzyme Q-10 30 MG capsule Take 30 mg by mouth daily.  . Probiotic Product (PROBIOTIC-10) CAPS Take by mouth daily.   No facility-administered encounter medications on file as of 04/02/2017.     Patient Care Team: Jerrol Banana., MD as PCP - General (Family Medicine)      Objective:   Vitals:  Vitals:   04/02/17 0916  BP: 98/62  Pulse: 62  Resp: 10  Temp: 98.2 F (36.8 C)  Weight: 191 lb (86.6 kg)  Height: 5' 10.5" (1.791 m)    Physical Exam  Constitutional: He is oriented to person, place, and time. He appears well-developed and well-nourished.  HENT:  Head: Normocephalic and atraumatic.  Right Ear: External ear normal.  Left Ear: External ear normal.  Mouth/Throat: Oropharynx is clear and moist.  Eyes: Pupils are equal, round, and reactive to light. Conjunctivae are normal.  Neck: Normal range of motion. Neck supple.  Cardiovascular: Normal rate, regular rhythm, normal heart sounds and intact distal pulses.  Exam reveals no gallop.   No murmur heard. Pulmonary/Chest: Effort normal and breath sounds normal. No respiratory distress. He has no wheezes.  Abdominal: Soft. He exhibits no distension. There is no tenderness. A hernia is present.  Small umbilical hernia.  Genitourinary: Rectum normal, prostate  normal and penis normal. Rectal exam shows guaiac negative stool. No penile tenderness.  Musculoskeletal: He exhibits no edema or tenderness.  Neurological: He is alert and oriented to person, place, and time.  Skin: No rash noted.  Psychiatric: He has a normal mood and affect. His behavior is normal. Judgment and thought content normal.   Depression Screen PHQ 2/9 Scores 04/02/2017 04/01/2016  PHQ - 2 Score 0 0  PHQ- 9 Score 2 -    Assessment & Plan:   1. Annual  physical exam - CBC with Differential/Platelet - Comprehensive metabolic panel - Lipid Panel With LDL/HDL Ratio - TSH - POCT urinalysis dipstick  2. Prostate cancer screening - PSA  3. Colon cancer screening - IFOBT POC (occult bld, rslt in office); Future  4. Umbilical hernia without obstruction and without gangrene Refer to Dr Fleet Contras for umbilical hernia. - Ambulatory referral to General Surgery 5.Right Groin Strain  Discussed health benefits of physical activity, and encouraged him to engage in regular exercise appropriate for his age and condition.  I have done the exam and reviewed the chart and it is accurate to the best of my knowledge. Development worker, community has been used and  any errors in dictation or transcription are unintentional. Miguel Aschoff M.D. [facility]

## 2017-04-03 LAB — LIPID PANEL WITH LDL/HDL RATIO
Cholesterol, Total: 169 mg/dL (ref 100–199)
HDL: 47 mg/dL (ref >39)
LDL CALC: 102 mg/dL — AB (ref 0–99)
LDl/HDL Ratio: 2.2 ratio (ref 0.0–3.6)
Triglycerides: 101 mg/dL (ref 0–149)
VLDL CHOLESTEROL CAL: 20 mg/dL (ref 5–40)

## 2017-04-03 LAB — CBC WITH DIFFERENTIAL/PLATELET
Basophils Absolute: 0 10*3/uL (ref 0.0–0.2)
Basos: 0 %
EOS (ABSOLUTE): 0.2 10*3/uL (ref 0.0–0.4)
Eos: 2 %
HEMOGLOBIN: 15.2 g/dL (ref 13.0–17.7)
Hematocrit: 44.3 % (ref 37.5–51.0)
IMMATURE GRANS (ABS): 0 10*3/uL (ref 0.0–0.1)
Immature Granulocytes: 0 %
LYMPHS: 34 %
Lymphocytes Absolute: 2.4 10*3/uL (ref 0.7–3.1)
MCH: 29.2 pg (ref 26.6–33.0)
MCHC: 34.3 g/dL (ref 31.5–35.7)
MCV: 85 fL (ref 79–97)
MONOCYTES: 8 %
Monocytes Absolute: 0.6 10*3/uL (ref 0.1–0.9)
NEUTROS ABS: 4 10*3/uL (ref 1.4–7.0)
Neutrophils: 56 %
Platelets: 214 10*3/uL (ref 150–379)
RBC: 5.2 x10E6/uL (ref 4.14–5.80)
RDW: 14.7 % (ref 12.3–15.4)
WBC: 7.2 10*3/uL (ref 3.4–10.8)

## 2017-04-03 LAB — COMPREHENSIVE METABOLIC PANEL
ALBUMIN: 4.6 g/dL (ref 3.5–5.5)
ALT: 21 IU/L (ref 0–44)
AST: 19 IU/L (ref 0–40)
Albumin/Globulin Ratio: 2.6 — ABNORMAL HIGH (ref 1.2–2.2)
Alkaline Phosphatase: 52 IU/L (ref 39–117)
BUN / CREAT RATIO: 11 (ref 9–20)
BUN: 12 mg/dL (ref 6–24)
Bilirubin Total: 0.9 mg/dL (ref 0.0–1.2)
CALCIUM: 9.2 mg/dL (ref 8.7–10.2)
CO2: 24 mmol/L (ref 20–29)
CREATININE: 1.09 mg/dL (ref 0.76–1.27)
Chloride: 100 mmol/L (ref 96–106)
GFR calc non Af Amer: 75 mL/min/{1.73_m2} (ref >59)
GFR, EST AFRICAN AMERICAN: 87 mL/min/{1.73_m2} (ref >59)
Globulin, Total: 1.8 g/dL (ref 1.5–4.5)
Glucose: 86 mg/dL (ref 65–99)
Potassium: 4.4 mmol/L (ref 3.5–5.2)
Sodium: 139 mmol/L (ref 134–144)
TOTAL PROTEIN: 6.4 g/dL (ref 6.0–8.5)

## 2017-04-03 LAB — TSH: TSH: 3.89 u[IU]/mL (ref 0.450–4.500)

## 2017-04-03 LAB — PSA: PROSTATE SPECIFIC AG, SERUM: 1.1 ng/mL (ref 0.0–4.0)

## 2017-04-04 ENCOUNTER — Encounter: Payer: Self-pay | Admitting: Family Medicine

## 2017-04-14 ENCOUNTER — Ambulatory Visit (INDEPENDENT_AMBULATORY_CARE_PROVIDER_SITE_OTHER): Payer: Managed Care, Other (non HMO) | Admitting: General Surgery

## 2017-04-14 ENCOUNTER — Encounter: Payer: Self-pay | Admitting: General Surgery

## 2017-04-14 VITALS — BP 122/70 | HR 83 | Ht 70.0 in | Wt 187.0 lb

## 2017-04-14 DIAGNOSIS — K429 Umbilical hernia without obstruction or gangrene: Secondary | ICD-10-CM

## 2017-04-14 NOTE — Patient Instructions (Signed)
Umbilical Hernia, Adult A hernia is a bulge of tissue that pushes through an opening between muscles. An umbilical hernia happens in the abdomen, near the belly button (umbilicus). The hernia may contain tissues from the small intestine, large intestine, or fatty tissue covering the intestines (omentum). Umbilical hernias in adults tend to get worse over time, and they require surgical treatment. There are several types of umbilical hernias. You may have:  A hernia located just above or below the umbilicus (indirect hernia). This is the most common type of umbilical hernia in adults.  A hernia that forms through an opening formed by the umbilicus (direct hernia).  A hernia that comes and goes (reducible hernia). A reducible hernia may be visible only when you strain, lift something heavy, or cough. This type of hernia can be pushed back into the abdomen (reduced).  A hernia that traps abdominal tissue inside the hernia (incarcerated hernia). This type of hernia cannot be reduced.  A hernia that cuts off blood flow to the tissues inside the hernia (strangulated hernia). The tissues can start to die if this happens. This type of hernia requires emergency treatment.  What are the causes? An umbilical hernia happens when tissue inside the abdomen presses on a weak area of the abdominal muscles. What increases the risk? You may have a greater risk of this condition if you:  Are obese.  Have had several pregnancies.  Have a buildup of fluid inside your abdomen (ascites).  Have had surgery that weakens the abdominal muscles.  What are the signs or symptoms? The main symptom of this condition is a painless bulge at or near the belly button. A reducible hernia may be visible only when you strain, lift something heavy, or cough. Other symptoms may include:  Dull pain.  A feeling of pressure.  Symptoms of a strangulated hernia may include:  Pain that gets increasingly worse.  Nausea and  vomiting.  Pain when pressing on the hernia.  Skin over the hernia becoming red or purple.  Constipation.  Blood in the stool.  How is this diagnosed? This condition may be diagnosed based on:  A physical exam. You may be asked to cough or strain while standing. These actions increase the pressure inside your abdomen and force the hernia through the opening in your muscles. Your health care provider may try to reduce the hernia by pressing on it.  Your symptoms and medical history.  How is this treated? Surgery is the only treatment for an umbilical hernia. Surgery for a strangulated hernia is done as soon as possible. If you have a small hernia that is not incarcerated, you may need to lose weight before having surgery. Follow these instructions at home:  Lose weight, if told by your health care provider.  Do not try to push the hernia back in.  Watch your hernia for any changes in color or size. Tell your health care provider if any changes occur.  You may need to avoid activities that increase pressure on your hernia.  Do not lift anything that is heavier than 10 lb (4.5 kg) until your health care provider says that this is safe.  Take over-the-counter and prescription medicines only as told by your health care provider.  Keep all follow-up visits as told by your health care provider. This is important. Contact a health care provider if:  Your hernia gets larger.  Your hernia becomes painful. Get help right away if:  You develop sudden, severe pain near the   area of your hernia.  You have pain as well as nausea or vomiting.  You have pain and the skin over your hernia changes color.  You develop a fever. This information is not intended to replace advice given to you by your health care provider. Make sure you discuss any questions you have with your health care provider. Document Released: 01/19/2016 Document Revised: 04/21/2016 Document Reviewed:  01/19/2016 Elsevier Interactive Patient Education  2018 [employer] Inc[employer]  

## 2017-04-14 NOTE — Progress Notes (Signed)
Patient ID: Christopher Shannon, male   DOB: 08-Oct-1960, 56 y.o.   MRN: [id number]  Chief Complaint  Patient presents with  . Other    umbilical hernia    HPI Christopher Shannon is a 56 y.o. male here for evaluation of an umbilical hernia. He states that he has had this for several years. He reports that he has no pain or discomfort with the area. He does report that it has gotten larger. He is not aware of any activities that aggravate the area. He does have a hiatal hernia.  HPI  Past Medical History:  Diagnosis Date  . GERD (gastroesophageal reflux disease)   . Hiatal hernia   . Joint pain     Past Surgical History:  Procedure Laterality Date  . NO PAST SURGERIES      Family History  Problem Relation Age of Onset  . Lupus Mother   . Stomach cancer Mother   . Thyroid disease Mother   . Heart disease Father   . Asthma Son     Social History Social History  Substance Use Topics  . Smoking status: Never Smoker  . Smokeless tobacco: Never Used  . Alcohol use No    No Known Allergies  Current Outpatient Prescriptions  Medication Sig Dispense Refill  . Multiple Vitamins-Minerals (MULTIVITAMIN ADULT PO) Take by mouth daily.    . Probiotic Product (PROBIOTIC-10) CAPS Take by mouth daily.     No current facility-administered medications for this visit.     Review of Systems Review of Systems  Constitutional: Negative.   Respiratory: Negative.   Cardiovascular: Negative.     Blood pressure 122/70, pulse 83, height 5\' 10"  (1.778 m), weight 187 lb (84.8 kg).  Physical Exam Physical Exam  Constitutional: He is oriented to person, place, and time. He appears well-developed and well-nourished.  Eyes: Conjunctivae are normal. No scleral icterus.  Neck: Neck supple.  Cardiovascular: Normal rate, regular rhythm and normal heart sounds.   Pulmonary/Chest: Effort normal and breath sounds normal.  Abdominal: Soft. Normal appearance and bowel sounds are normal. There  is no hepatomegaly. There is no tenderness. A hernia (small umbilical hernia) is present.    Lymphadenopathy:    He has no cervical adenopathy.  Neurological: He is alert and oriented to person, place, and time.  Skin: Skin is warm and dry.    Data Reviewed PCP note of 04/02/2017.  Assessment    Reducible umbilical hernia.    Plan      Hernia precautions and incarceration were discussed with the patient. If they develop symptoms of an incarcerated hernia, they were encouraged to seek prompt medical attention.  The patient is presently having minimal symptoms, and he may defer elective repair. Based on its present size, it's unlikely the prosthetic mesh would be recommended. The role for prosthetic mesh and defects 3 cm above was reviewed as well as the tiny risk of mesh infection.   HPI, Physical Exam, Assessment and Plan have been scribed under the direction and in the presence of Hervey Ard, MD.  Gaspar Cola, CMA  I have completed the exam and reviewed the above documentation for accuracy and completeness.  I agree with the above.  Haematologist has been used and any errors in dictation or transcription are unintentional.  Hervey Ard, M.D., F.A.C.S.  Robert Bellow 04/15/2017, 7:14 AM  Patient will contact the office if he would like to proceed with surgery. He may like to have this completed sometime in  October or November. Patient would be a candidate for a phone interview per Dr. Bary Castilla.   Dominga Ferry, CMA

## 2017-04-15 DIAGNOSIS — K429 Umbilical hernia without obstruction or gangrene: Secondary | ICD-10-CM | POA: Insufficient documentation

## 2017-04-15 HISTORY — DX: Umbilical hernia without obstruction or gangrene: K42.9

## 2017-06-02 ENCOUNTER — Ambulatory Visit (INDEPENDENT_AMBULATORY_CARE_PROVIDER_SITE_OTHER): Payer: Managed Care, Other (non HMO) | Admitting: Family Medicine

## 2017-06-02 VITALS — BP 114/66 | HR 82 | Temp 98.1°F | Resp 14 | Wt 192.2 lb

## 2017-06-02 DIAGNOSIS — S39011A Strain of muscle, fascia and tendon of abdomen, initial encounter: Secondary | ICD-10-CM

## 2017-06-02 MED ORDER — NAPROXEN 500 MG PO TABS: 500.0000 mg | ORAL_TABLET | Freq: Two times a day (BID) | ORAL | 2 refills | Status: DC

## 2017-06-02 NOTE — Progress Notes (Signed)
Christopher Shannon  MRN: [id number] DOB: Jan 30, 1961  Subjective:  HPI  Patient is here to discuss abdominal pain that has been lingering for the past 4 months (around June 2018). Pain is present in multiple areas of the abdomen. Pain is present most of the time, physical activity makes it worse. Not related to the food that he can tell. No abnormal bowel issues, he has more of a constipation issue at times but this has not changed. No fever or chills. No urinary issues. No nausea or vomiting. Routine lab work was done on 04/02/17 during last office visit. Wt Readings from Last 3 Encounters:  06/02/17 192 lb 3.2 oz (87.2 kg)  04/14/17 187 lb (84.8 kg)  04/02/17 191 lb (86.6 kg)   Patient Active Problem List   Diagnosis Date Noted  . Umbilical hernia without obstruction and without gangrene 04/15/2017  . Irritable bowel syndrome with constipation 01/04/2015  . HYPOTHYROIDISM 07/06/2010  . DYSLIPIDEMIA 07/06/2010  . GERD 07/06/2010  . Pruritic disorder 07/22/2007  . Benign prostatic hypertrophy without urinary obstruction 10/24/2006    Past Medical History:  Diagnosis Date  . GERD (gastroesophageal reflux disease)   . Hiatal hernia   . Joint pain     Social History   Social History  . Marital status: Married    Spouse name: N/A  . Number of children: N/A  . Years of education: N/A   Occupational History  . Not on file.   Social History Main Topics  . Smoking status: Never Smoker  . Smokeless tobacco: Never Used  . Alcohol use No  . Drug use: No  . Sexual activity: Not on file   Other Topics Concern  . Not on file   Social History Narrative  . No narrative on file    Outpatient Encounter Prescriptions as of 06/02/2017  Medication Sig  . Multiple Vitamins-Minerals (MULTIVITAMIN ADULT PO) Take by mouth daily.  . Probiotic Product (PROBIOTIC-10) CAPS Take by mouth daily.   No facility-administered encounter medications on file as of 06/02/2017.     No Known  Allergies  Review of Systems  Constitutional: Negative.   Eyes: Negative.   Respiratory: Negative.   Cardiovascular: Negative.   Gastrointestinal: Positive for abdominal pain and constipation. Negative for heartburn, nausea and vomiting.  Musculoskeletal: Negative.   Skin: Negative.   Neurological: Negative.   Psychiatric/Behavioral: Negative.     Objective:  BP 114/66   Pulse 82   Temp 98.1 F (36.7 C)   Resp 14   Wt 192 lb 3.2 oz (87.2 kg)   BMI 27.58 kg/m   Physical Exam  Constitutional: He is oriented to person, place, and time and well-developed, well-nourished, and in no distress.  HENT:  Head: Normocephalic and atraumatic.  Eyes: Conjunctivae are normal. No scleral icterus.  Neck: No thyromegaly present.  Cardiovascular: Normal rate, regular rhythm and normal heart sounds.   Pulmonary/Chest: Effort normal and breath sounds normal.  Abdominal: Soft.  Musculoskeletal: He exhibits no edema or deformity.  Mild tenderness of the lower abdomen with straight leg raise off the table of both legs  Neurological: He is alert and oriented to person, place, and time. Gait normal. GCS score is 15.  Skin: Skin is warm and dry.  Psychiatric: Mood, memory, affect and judgment normal.    Assessment and Plan :  1. Strain of abdominal muscle, initial encounter I think current symptoms coming from muscular strain. Try naproxen. Refer to physical therapy and follow up as needed  for this issue.I do not think that this is a GI issue. - Ambulatory referral to Physical Therapy - naproxen (NAPROSYN) 500 MG tablet; Take 1 tablet (500 mg total) by mouth 2 (two) times daily with a meal.  Dispense: 60 tablet; Refill: 2  HPI, Exam and A&P transcribed by Tiffany Kocher, RMA under direction and in the presence of Miguel Aschoff, MD. I have done the exam and reviewed the chart and it is accurate to the best of my knowledge. Development worker, community has been used and  any errors in dictation or  transcription are unintentional. Miguel Aschoff M.D. [facility]

## 2017-06-09 ENCOUNTER — Ambulatory Visit: Payer: 59 | Admitting: Physical Therapy

## 2017-06-11 ENCOUNTER — Encounter: Payer: 59 | Admitting: Physical Therapy

## 2017-06-16 ENCOUNTER — Ambulatory Visit: Payer: 59 | Admitting: Physical Therapy

## 2017-06-18 ENCOUNTER — Ambulatory Visit: Payer: 59 | Admitting: Physical Therapy

## 2017-06-23 ENCOUNTER — Ambulatory Visit: Payer: 59 | Admitting: Physical Therapy

## 2017-06-30 ENCOUNTER — Ambulatory Visit: Payer: 59 | Admitting: Physical Therapy

## 2017-07-02 ENCOUNTER — Ambulatory Visit: Payer: 59 | Admitting: Physical Therapy

## 2017-09-16 ENCOUNTER — Encounter: Payer: Self-pay | Admitting: General Surgery

## 2017-09-16 ENCOUNTER — Ambulatory Visit (INDEPENDENT_AMBULATORY_CARE_PROVIDER_SITE_OTHER): Payer: Managed Care, Other (non HMO) | Admitting: General Surgery

## 2017-09-16 VITALS — BP 114/68 | HR 68 | Resp 12 | Ht 70.0 in | Wt 195.0 lb

## 2017-09-16 DIAGNOSIS — K429 Umbilical hernia without obstruction or gangrene: Secondary | ICD-10-CM | POA: Diagnosis not present

## 2017-09-16 DIAGNOSIS — M6281 Muscle weakness (generalized): Secondary | ICD-10-CM | POA: Diagnosis not present

## 2017-09-16 NOTE — Progress Notes (Signed)
Patient ID: Christopher Shannon, male   DOB: 01/29/1961, 57 y.o.   MRN: [id number]  Chief Complaint  Patient presents with  . 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 male.  Here to discuss surgery. Last seen in 1751 with an umbilical hernia. He states that he has had this for several years. He reports that he has some pain and discomfort with the area especially with coughing and sneezing. Dull lower abdominal pain. He does admit to chronic constipation, he takes herbal supplements occasionally. Bowels move every 3-4 days, he thinks probiotics help his bowels move every 2 days. He does report that it has not gotten larger. He states he is having some tip of the penis discomfort with urination. He does admit to enlarged prostate.   He was playing tennis in June and felt some sudden pain lower abdomen. He does admit to inner thigh pain. Naprosyn did help at that time but he did not go to physical therapy.  HPI  Past Medical History:  Diagnosis Date  . GERD (gastroesophageal reflux disease)   . Hiatal hernia   . Joint pain     Past Surgical History:  Procedure Laterality Date  . COLONOSCOPY  2011  . NO PAST SURGERIES    . UPPER GI ENDOSCOPY      Family History  Problem Relation Age of Onset  . Lupus Mother   . Stomach cancer Mother   . Thyroid disease Mother   . Heart disease Father   . Asthma Son     Social History Social History   Tobacco Use  . Smoking status: Never Smoker  . Smokeless tobacco: Never Used  Substance Use Topics  . Alcohol use: No  . Drug use: No    No Known Allergies  Current Outpatient Medications  Medication Sig Dispense Refill  . naproxen (NAPROSYN) 500 MG tablet Take 1 tablet (500 mg total) by mouth 2 (two) times daily with a meal. (Patient taking differently: Take 500 mg by mouth as needed. ) 60 tablet 2  . Probiotic Product (PROBIOTIC-10) CAPS Take by mouth daily.     No current facility-administered medications for this visit.      Review of Systems Review of Systems  Respiratory: Negative.   Cardiovascular: Negative.   Gastrointestinal: Positive for abdominal pain and constipation.  Neurological: Positive for weakness.  The patient reports he feels that his lower extremity strength is less than it has been.  He is able to walk up 3 flights of stairs, and down without difficulty.  Blood pressure 114/68, pulse 68, resp. rate 12, height 5\' 10"  (1.778 m), weight 195 lb (88.5 kg), SpO2 100 %.  Physical Exam Physical Exam  Constitutional: He is oriented to person, place, and time. He appears well-developed and well-nourished.  HENT:  Mouth/Throat: Oropharynx is clear and moist.  Eyes: Conjunctivae are normal. No scleral icterus.  Neck: Neck supple.  Cardiovascular: Normal rate, regular rhythm, normal heart sounds and intact distal pulses.  Pulmonary/Chest: Effort normal and breath sounds normal.  Abdominal: Soft. Normal appearance and bowel sounds are normal. There is no tenderness. A hernia is present.    diastasis recti noted. 1.5 cm defect at umbilicus.  Genitourinary:     Lymphadenopathy:    He has no cervical adenopathy.       Right: No inguinal adenopathy present.       Left: No inguinal adenopathy present.  Neurological: He is alert and oriented to person, place, and  time. He has normal strength.  Reflex Scores:      Tricep reflexes are 2+ on the right side and 2+ on the left side.      Bicep reflexes are 2+ on the right side and 2+ on the left side.      Brachioradialis reflexes are 1+ on the right side and 1+ on the left side.      Patellar reflexes are 3+ on the right side and 3+ on the left side.      Achilles reflexes are 3+ on the right side and 3+ on the left side. Motor strength testing in the major groups of lower extremity were 4-5/5+.  Skin: Skin is warm and dry.  Psychiatric: His behavior is normal.    Data Reviewed Personal communication with Miguel Aschoff, MD.  Assessment     Umbilical hernia, unchanged from earlier exam.  Minimally symptomatic.  Lower abdominal discomfort and a sense of lower extremity weakness since a tennis injury in June/July 2018.    Plan    Patient seems hyperreflexic in lower extremities and has very slightly decreased motor strength, although his ability to walk up 3 flights of stairs without difficulty would speak against a major motor deficit.  Symmetrical muscle injury from the described event during playing tennis seems unlikely.  The patient may benefit from imaging studies, but would defer until his upcoming evaluation with his PCP.  I do not think repair of the umbilical hernia is going to address the lower abdominal discomfort he is experiencing at this time.  He is not noted a pattern of worsening symptoms with bowel movements.  Long-standing nocturia, 2-3 times per night for which she is not interested in taking any medication.  Patient has noted benefit from the use of probiotics in regards to regulating his somewhat irregular bowel movements, and he was encouraged to do this as there is no downside to this therapy.  In discussion with [name], he will reassess the patient and determine if neurologic referral is appropriate.       Recommend following up with Urology regarding enlarged prostate.       HPI, Physical Exam, Assessment and Plan have been scribed under the direction and in the presence of Robert Bellow, MD. Karie Fetch, RN . I have completed the exam and reviewed the above documentation for accuracy and completeness.  I agree with the above.  Haematologist has been used and any errors in dictation or transcription are unintentional.  Hervey Ard, M.D., F.A.C.S.   Forest Gleason Kamelia Lampkins 09/17/2017, 1:20 PM

## 2017-09-16 NOTE — Patient Instructions (Addendum)
Encouraged the use of probiotics for regulating his bowel habits. Recommend following up with Urology regarding enlarged prostate. Discuss with Dr Tandy Gaw, Adult A hernia is the bulging of an organ or tissue through a weak spot in the muscles of the abdomen (abdominal wall). Hernias develop most often near the navel or groin. There are many kinds of hernias. Common kinds include:  Femoral hernia. This kind of hernia develops under the groin in the upper thigh area.  Inguinal hernia. This kind of hernia develops in the groin or scrotum.  Umbilical hernia. This kind of hernia develops near the navel.  Hiatal hernia. This kind of hernia causes part of the stomach to be pushed up into the chest.  Incisional hernia. This kind of hernia bulges through a scar from an abdominal surgery.  What are the causes? This condition may be caused by:  Heavy lifting.  Coughing over a long period of time.  Straining to have a bowel movement.  An incision made during an abdominal surgery.  A birth defect (congenital defect).  Excess weight or obesity.  Smoking.  Poor nutrition.  Cystic fibrosis.  Excess fluid in the abdomen.  Undescended testicles.  What are the signs or symptoms? Symptoms of a hernia include:  A lump on the abdomen. This is the first sign of a hernia. The lump may become more obvious with standing, straining, or coughing. It may get bigger over time if it is not treated or if the condition causing it is not treated.  Pain. A hernia is usually painless, but it may become painful over time if treatment is delayed. The pain is usually dull and may get worse with standing or lifting heavy objects.  Sometimes a hernia gets tightly squeezed in the weak spot (strangulated) or stuck there (incarcerated) and causes additional symptoms. These symptoms may include:  Vomiting.  Nausea.  Constipation.  Irritability.  How is this diagnosed? A hernia may be  diagnosed with:  A physical exam. During the exam your health care provider may ask you to cough or to make a specific movement, because a hernia is usually more visible when you move.  Imaging tests. These can include: ? X-rays. ? Ultrasound. ? CT scan.  How is this treated? A hernia that is small and painless may not need to be treated. A hernia that is large or painful may be treated with surgery. Inguinal hernias may be treated with surgery to prevent incarceration or strangulation. Strangulated hernias are always treated with surgery, because lack of blood to the trapped organ or tissue can cause it to die. Surgery to treat a hernia involves pushing the bulge back into place and repairing the weak part of the abdomen. Follow these instructions at home:  Avoid straining.  Do not lift anything heavier than 10 lb (4.5 kg).  Lift with your leg muscles, not your back muscles. This helps avoid strain.  When coughing, try to cough gently.  Prevent constipation. Constipation leads to straining with bowel movements, which can make a hernia worse or cause a hernia repair to break down. You can prevent constipation by: ? Eating a high-fiber diet that includes plenty of fruits and vegetables. ? Drinking enough fluids to keep your urine clear or pale yellow. Aim to drink 6-8 glasses of water per day. ? Using a stool softener as directed by your health care provider.  Lose weight, if you are overweight.  Do not use any tobacco products, including cigarettes, chewing  tobacco, or electronic cigarettes. If you need help quitting, ask your health care provider.  Keep all follow-up visits as directed by your health care provider. This is important. Your health care provider may need to monitor your condition. Contact a health care provider if:  You have swelling, redness, and pain in the affected area.  Your bowel habits change. Get help right away if:  You have a fever.  You have  abdominal pain that is getting worse.  You feel nauseous or you vomit.  You cannot push the hernia back in place by gently pressing on it while you are lying down.  The hernia: ? Changes in shape or size. ? Is stuck outside the abdomen. ? Becomes discolored. ? Feels hard or tender. This information is not intended to replace advice given to you by your health care provider. Make sure you discuss any questions you have with your health care provider. Document Released: 08/19/2005 Document Revised: 01/17/2016 Document Reviewed: 06/29/2014 Elsevier Interactive Patient Education  2017 [employer].

## 2017-09-17 DIAGNOSIS — M6281 Muscle weakness (generalized): Secondary | ICD-10-CM | POA: Insufficient documentation

## 2017-09-22 ENCOUNTER — Ambulatory Visit (INDEPENDENT_AMBULATORY_CARE_PROVIDER_SITE_OTHER): Payer: Managed Care, Other (non HMO) | Admitting: Family Medicine

## 2017-09-22 ENCOUNTER — Other Ambulatory Visit: Payer: Self-pay

## 2017-09-22 VITALS — BP 130/80 | HR 84 | Temp 98.0°F | Resp 16 | Wt 197.0 lb

## 2017-09-22 DIAGNOSIS — Z8249 Family history of ischemic heart disease and other diseases of the circulatory system: Secondary | ICD-10-CM

## 2017-09-22 DIAGNOSIS — Z1211 Encounter for screening for malignant neoplasm of colon: Secondary | ICD-10-CM | POA: Diagnosis not present

## 2017-09-22 DIAGNOSIS — S3994XS Unspecified injury of external genitals, sequela: Secondary | ICD-10-CM | POA: Diagnosis not present

## 2017-09-22 DIAGNOSIS — R102 Pelvic and perineal pain: Secondary | ICD-10-CM | POA: Diagnosis not present

## 2017-09-22 DIAGNOSIS — N400 Enlarged prostate without lower urinary tract symptoms: Secondary | ICD-10-CM

## 2017-09-22 LAB — POCT URINALYSIS DIPSTICK
Bilirubin, UA: NEGATIVE
Glucose, UA: NEGATIVE
Ketones, UA: NEGATIVE
LEUKOCYTES UA: NEGATIVE
Nitrite, UA: NEGATIVE
PH UA: 5 (ref 5.0–8.0)
PROTEIN UA: NEGATIVE
RBC UA: NEGATIVE
Spec Grav, UA: 1.01 (ref 1.010–1.025)
UROBILINOGEN UA: 0.2 U/dL

## 2017-09-22 LAB — IFOBT (OCCULT BLOOD): IMMUNOLOGICAL FECAL OCCULT BLOOD TEST: NEGATIVE

## 2017-09-22 NOTE — Progress Notes (Signed)
Christopher Shannon  MRN: [id number] DOB: 10-29-1960  Subjective:  HPI   The patient is a 57 year old male who presents for follow up of his umbilical hernia, abdominal discomfort and lower extremity discomfort.  He was last seen by [place] on 06/02/17 for abdominal muscle strain.  He was referred to physical therapy.  Since that time the patient was seen  By Dr Bary Castilla for umbilical hernia and abdominal discomfort.  He reported at that time a  Sense of lower extremity weakness since June or so when he had a tennis injury.  Dr Bary Castilla recommended he be seen by his PCP for this.  Patient states that Dr Fleet Contras instructed him to be seen by [place] before he could proceed with treatment for the hernia.  The patient said that he has some pain and bulging at the umbilical but more of the pain seems to be originating from the pelvis more.     Patient Active Problem List   Diagnosis Date Noted  . Muscle weakness 09/17/2017  . Umbilical hernia without obstruction and without gangrene 04/15/2017  . Irritable bowel syndrome with constipation 01/04/2015  . HYPOTHYROIDISM 07/06/2010  . DYSLIPIDEMIA 07/06/2010  . GERD 07/06/2010  . Pruritic disorder 07/22/2007  . Benign prostatic hypertrophy without urinary obstruction 10/24/2006    Past Medical History:  Diagnosis Date  . GERD (gastroesophageal reflux disease)   . Hiatal hernia   . Joint pain     Social History   Socioeconomic History  . Marital status: Married    Spouse name: Not on file  . Number of children: Not on file  . Years of education: Not on file  . Highest education level: Not on file  Social Needs  . Financial resource strain: Not on file  . Food insecurity - worry: Not on file  . Food insecurity - inability: Not on file  . Transportation needs - medical: Not on file  . Transportation needs - non-medical: Not on file  Occupational History  . Not on file  Tobacco Use  . Smoking status: Never Smoker  . Smokeless tobacco:  Never Used  Substance and Sexual Activity  . Alcohol use: No  . Drug use: No  . Sexual activity: Not on file  Other Topics Concern  . Not on file  Social History Narrative  . Not on file    Outpatient Encounter Medications as of 09/22/2017  Medication Sig  . naproxen (NAPROSYN) 500 MG tablet Take 1 tablet (500 mg total) by mouth 2 (two) times daily with a meal. (Patient taking differently: Take 500 mg by mouth as needed. )  . Probiotic Product (PROBIOTIC-10) CAPS Take by mouth daily.   No facility-administered encounter medications on file as of 09/22/2017.     No Known Allergies  Review of Systems  Constitutional: Positive for malaise/fatigue. Negative for fever.  Eyes: Negative.   Respiratory: Negative for cough, shortness of breath and wheezing.   Cardiovascular: Positive for chest pain and claudication (aches in the lower extremities at night). Negative for palpitations, orthopnea and leg swelling.  Gastrointestinal: Positive for abdominal pain, constipation and heartburn. Negative for blood in stool, diarrhea, melena, nausea and vomiting.  Genitourinary: Positive for dysuria. Negative for hematuria.  Skin: Negative.   Neurological: Negative for weakness.  Endo/Heme/Allergies: Negative.   Psychiatric/Behavioral: Negative.     Objective:  BP 130/80 (BP Location: Right Arm, Patient Position: Sitting, Cuff Size: Normal)   Pulse 84   Temp 98 F (36.7 C) (Oral)  Resp 16   Wt 197 lb (89.4 kg)   BMI 28.27 kg/m   Physical Exam  Constitutional: He is oriented to person, place, and time and well-developed, well-nourished, and in no distress.  HENT:  Head: Normocephalic and atraumatic.  Right Ear: External ear normal.  Left Ear: External ear normal.  Nose: Nose normal.  Eyes: Conjunctivae are normal. No scleral icterus.  Neck: No thyromegaly present.  Cardiovascular: Normal rate, regular rhythm and normal heart sounds.  Pulmonary/Chest: Effort normal and breath sounds  normal.  Lymphadenopathy:    He has no cervical adenopathy.  Neurological: He is alert and oriented to person, place, and time. He has normal reflexes. Gait normal. GCS score is 15.  DTR brisk /3+.  Skin: Skin is warm and dry.  Psychiatric: Mood, memory, affect and judgment normal.    Assessment and Plan :  1. Colon cancer screening  - IFOBT POC (occult bld, rslt in office); Future - IFOBT POC (occult bld, rslt in office)  2. Pelvic pain Possibly related to GU trauma last summer? - POCT urinalysis dipstick - Ambulatory referral to Urology  3. Traumatic injury of external genitalia, sequela  - Ambulatory referral to Urology  4. Family history of early CAD  - Ambulatory referral to Cardiology  5. Family history of CHF (congestive heart failure) - Ambulatory referral to Cardiology Pt worried about this history.  I have done the exam and reviewed the chart and it is accurate to the best of my knowledge. Development worker, community has been used and  any errors in dictation or transcription are unintentional. Miguel Aschoff M.D. [facility]

## 2017-09-26 ENCOUNTER — Encounter: Payer: Self-pay | Admitting: Family Medicine

## 2017-10-09 ENCOUNTER — Ambulatory Visit: Payer: Managed Care, Other (non HMO)

## 2017-10-13 ENCOUNTER — Ambulatory Visit (INDEPENDENT_AMBULATORY_CARE_PROVIDER_SITE_OTHER): Payer: Managed Care, Other (non HMO) | Admitting: Urology

## 2017-10-13 ENCOUNTER — Encounter: Payer: Self-pay | Admitting: Urology

## 2017-10-13 VITALS — BP 121/73 | HR 73 | Ht 70.0 in | Wt 185.0 lb

## 2017-10-13 DIAGNOSIS — R350 Frequency of micturition: Secondary | ICD-10-CM | POA: Diagnosis not present

## 2017-10-13 DIAGNOSIS — R102 Pelvic and perineal pain: Secondary | ICD-10-CM

## 2017-10-13 MED ORDER — ALFUZOSIN HCL ER 10 MG PO TB24: 10.0000 mg | ORAL_TABLET | Freq: Every day | ORAL | 0 refills | Status: DC

## 2017-10-13 NOTE — Progress Notes (Signed)
10/13/2017 3:46 PM   [name] [facility] 03/28/1961 [id number]  Referring provider: Jerrol Banana., MD [address] [place], [place] [address]  Chief Complaint  Patient presents with  . Pelvic Pain    New Patient    HPI: Christopher Shannon is a 57 year old male seen in consultation at the request of [name] for evaluation of pelvic pain and lower urinary tract symptoms. He states in May 2018 he was playing tennis and took a direct hit from a tennis ball to the head of the penis.  He noted pain but had no bleeding, bruising or swelling.  Approximately 1 month later while playing tennis he was running and felt a "snapping" in his suprapubic region and has had persistent suprapubic discomfort.  His pain was initially worse with increased movement or bending.  He does have some radiation of the discomfort to the inner thigh region.  Presently there are no identifiable precipitating, aggravating or alleviating factors and the severity is rated mild.  Overall he thinks he is improving.  On evaluation by [name] it was felt his pain was most likely musculoskeletal in etiology.  He was referred to see if this discomfort could be related to his prior penile trauma.  He has also seen [name] for an umbilical hernia who did not think his pelvic pain was related to the hernia.  He does have mild to moderate lower urinary tract symptoms including intermittent urinary stream, frequency and nocturia x2.  He has occasional urgency and postvoid dribbling.  He does relate to a weak urinary stream.   PMH: Past Medical History:  Diagnosis Date  . Arthritis   . GERD (gastroesophageal reflux disease)   . Hiatal hernia   . Joint pain     Surgical History: Past Surgical History:  Procedure Laterality Date  . COLONOSCOPY  2011  . NO PAST SURGERIES    . UPPER GI ENDOSCOPY      Home Medications:  Allergies as of 10/13/2017   No Known Allergies     Medication List       Accurate as of 10/13/17  3:46 PM. Always use your most recent med list.          naproxen 500 MG tablet Commonly known as:  NAPROSYN Take 1 tablet (500 mg total) by mouth 2 (two) times daily with a meal.   PROBIOTIC-10 Caps Take by mouth daily.       Allergies: No Known Allergies  Family History: Family History  Problem Relation Age of Onset  . Lupus Mother   . Stomach cancer Mother   . Thyroid disease Mother   . Heart disease Father   . Asthma Son     Social History:  reports that  has never smoked. he has never used smokeless tobacco. He reports that he does not drink alcohol or use drugs.  ROS: UROLOGY Frequent Urination?: Yes Hard to postpone urination?: No Burning/pain with urination?: No Get up at night to urinate?: Yes Leakage of urine?: No Urine stream starts and stops?: Yes Trouble starting stream?: No Do you have to strain to urinate?: No Blood in urine?: No Urinary tract infection?: No Sexually transmitted disease?: No Injury to kidneys or bladder?: No Painful intercourse?: No Weak stream?: Yes Erection problems?: No Penile pain?: No  Gastrointestinal Nausea?: No Vomiting?: No Indigestion/heartburn?: Yes Diarrhea?: No Constipation?: Yes  Constitutional Fever: No Night sweats?: No Weight loss?: No Fatigue?: Yes  Skin Skin rash/lesions?: No Itching?: No  Eyes Blurred vision?: No Double vision?: No  Ears/Nose/Throat Sore throat?: No Sinus problems?: No  Hematologic/Lymphatic Swollen glands?: No Easy bruising?: No  Cardiovascular Leg swelling?: No Chest pain?: No  Respiratory Cough?: No Shortness of breath?: No  Endocrine Excessive thirst?: No  Musculoskeletal Back pain?: No Joint pain?: No  Neurological Headaches?: No Dizziness?: No  Psychologic Depression?: No Anxiety?: No  Physical Exam: BP 121/73   Pulse 73   Ht 5\' 10"  (1.778 m)   Wt 185 lb (83.9 kg)   BMI 26.54 kg/m   Constitutional:  Alert and  oriented, No acute distress. HEENT: [place] AT, moist mucus membranes.  Trachea midline, no masses. Cardiovascular: No clubbing, cyanosis, or edema. Respiratory: Normal respiratory effort, no increased work of breathing. GI: Abdomen is soft, nontender, nondistended, no abdominal masses GU: No CVA tenderness.  Penis is circumcised without lesions.  The meatus is appearance without stricture.  Testes descended bilaterally without masses or tenderness, cord/epididymes palpably normal.  Prostate 40 g with mild tenderness.  No induration or nodules.  No pelvic floor tenderness. Skin: No rashes, bruises or suspicious lesions. Lymph: No cervical or inguinal adenopathy. Neurologic: Grossly intact, no focal deficits, moving all 4 extremities. Psychiatric: Normal mood and affect.  Laboratory Data: Lab Results  Component Value Date   WBC 7.2 04/02/2017   HGB 15.2 04/02/2017   HCT 44.3 04/02/2017   MCV 85 04/02/2017   PLT 214 04/02/2017    Lab Results  Component Value Date   CREATININE 1.09 04/02/2017    Lab Results  Component Value Date   PSA1 1.1 04/02/2017   PSA1 1.2 04/01/2016   PSA1 0.8 02/16/2015    Assessment & Plan:   57 year old male with pelvic pain.  Unlikely his pelvic pain and lower urinary tract symptoms are related to the penile trauma which does not appear significant.  Based on his history his pain is suspicious for a musculoskeletal etiology.  I did discuss the possibility of inflammatory prostatitis and have recommended a trial of tamsulosin to see if his voiding symptoms and discomfort improved.  His formulary did not cover tamsulosin and Rx alfuzosin was sent. He does have some radiation of his pain to the medial thighs and a neuropathic etiology is also another possibility.  1. Pelvic pain in male   2. Urinary frequency  - Urinalysis, Complete    Abbie Sons, MD  [facility] [address] [place], [place] [address] [phone]

## 2017-10-14 LAB — URINALYSIS, COMPLETE
Bilirubin, UA: NEGATIVE
GLUCOSE, UA: NEGATIVE
KETONES UA: NEGATIVE
LEUKOCYTES UA: NEGATIVE
NITRITE UA: NEGATIVE
Protein, UA: NEGATIVE
RBC, UA: NEGATIVE
SPEC GRAV UA: 1.02 (ref 1.005–1.030)
Urobilinogen, Ur: 0.2 mg/dL (ref 0.2–1.0)
pH, UA: 7 (ref 5.0–7.5)

## 2017-10-14 LAB — MICROSCOPIC EXAMINATION
Bacteria, UA: NONE SEEN
RBC, UA: NONE SEEN /hpf (ref 0–?)

## 2017-10-20 NOTE — Progress Notes (Signed)
cardiology Office Note  Date:  10/22/2017   ID:  Renn, [place]rocco Feb 07, 1961, MRN [id number]  PCP:  Jerrol Banana., MD   Chief Complaint  Patient presents with  . Other    Referred by [name] for family history of heart disease. Patient c/o chest discomfort and numbness in hands and arms when lying down. Meds reviewed verbally with patient.    HPI:  Mr Hellmer is a 57 yo male with history of hyperlipidemia Referred by [name] for symptoms of chest discomfort, numbness in his hands and arms  He is concerned about Family hx of early CAD, CHF  He has good exercise tolerance, no chest pain on exertion Sometimes at rest will have chest tightness, dull ache, not intense  Arms will sometimes go numb when he lays on his side in bed No regular exercise program, but reports no limitations   He is concerned about chronic fatigue.  Reports that he sleeps relatively well   Other past medical includes umbilical hernia, abdominal discomfort and lower extremity discomfort.  06/02/17 for abdominal muscle strain.  Stress test 2007  Total chol 169, LDL 102 on no medication Non-smoker, no diabetes  EKG personally reviewed by myself on todays visit Shows normal sinus rhythm with rate 75 bpm no significant ST or T wave changes   PMH:   has a past medical history of Arthritis, GERD (gastroesophageal reflux disease), Hiatal hernia, and Joint pain.  PSH:    Past Surgical History:  Procedure Laterality Date  . COLONOSCOPY  2011  . NO PAST SURGERIES    . UPPER GI ENDOSCOPY      Current Outpatient Medications  Medication Sig Dispense Refill  . naproxen (NAPROSYN) 500 MG tablet Take 1 tablet (500 mg total) by mouth 2 (two) times daily with a meal. (Patient taking differently: Take 500 mg by mouth as needed. ) 60 tablet 2  . Probiotic Product (PROBIOTIC-10) CAPS Take by mouth daily.     No current facility-administered medications for this visit.      Allergies:    Patient has no known allergies.   Social History:  The patient  reports that  has never smoked. he has never used smokeless tobacco. He reports that he does not drink alcohol or use drugs.   Family History:   family history includes Asthma in his son; Heart disease in his father; Lupus in his mother; Stomach cancer in his mother; Thyroid disease in his mother.    Review of Systems: Review of Systems  Constitutional: Negative.   Respiratory: Negative.   Cardiovascular: Positive for chest pain.  Gastrointestinal: Negative.   Musculoskeletal: Negative.   Neurological: Negative.   Psychiatric/Behavioral: The patient is nervous/anxious.   All other systems reviewed and are negative.    PHYSICAL EXAM: VS:  BP 118/60 (BP Location: Left Arm, Patient Position: Sitting, Cuff Size: Normal)   Pulse 75   Ht 5\' 10"  (1.778 m)   Wt 193 lb (87.5 kg)   BMI 27.69 kg/m  , BMI Body mass index is 27.69 kg/m. GEN: Well nourished, well developed, in no acute distress  HEENT: normal  Neck: no JVD, carotid bruits, or masses Cardiac: RRR; no murmurs, rubs, or gallops,no edema  Respiratory:  clear to auscultation bilaterally, normal work of breathing GI: soft, nontender, nondistended, + BS MS: no deformity or atrophy  Skin: warm and dry, no rash Neuro:  Strength and sensation are intact Psych: euthymic mood, full affect    Recent  Labs: 04/02/2017: ALT 21; BUN 12; Creatinine, Ser 1.09; Hemoglobin 15.2; Platelets 214; Potassium 4.4; Sodium 139; TSH 3.890    Lipid Panel Lab Results  Component Value Date   CHOL 169 04/02/2017   HDL 47 04/02/2017   LDLCALC 102 (H) 04/02/2017   TRIG 101 04/02/2017      Wt Readings from Last 3 Encounters:  10/22/17 193 lb (87.5 kg)  10/13/17 185 lb (83.9 kg)  09/22/17 197 lb (89.4 kg)       ASSESSMENT AND PLAN:  Family history of heart disease - Plan: EKG 12-Lead, CT CARDIAC SCORING Long discussion, recommended CT coronary calcium scoring for risk  stratification No significant risk factors, non-smoker, no diabetes, cholesterol is very reasonable Stress test would likely be unrevealing  Chest pain, unspecified type Atypical in nature, presenting at rest Denies any symptoms with exertion Recommend he start regular exercise program  Anxiety Numerous issues to discuss today including fatigue, chest pain, pulsation in his head, numbness in his arms among other things  Arm numbness Likely positional in nature, less likely circulatory as he was concerned about Good extremity pulses  Gastroesophageal reflux disease without esophagitis Reports symptoms are stable  Fatigue Recommended regular exercise program, make sure he is sleeping well  Could try B12 and vitamin D  Disposition:   F/U as needed  Patient was seen in consultation for [name] and will be referred back to his office for ongoing care of the issues detailed above   Total encounter time more than 60 minutes  Greater than 50% was spent in counseling and coordination of care with the patient    Orders Placed This Encounter  Procedures  . CT CARDIAC SCORING  . EKG 12-Lead     Signed, Esmond Plants, M.D., Ph.D. 10/22/2017  [place], [place]

## 2017-10-22 ENCOUNTER — Ambulatory Visit (INDEPENDENT_AMBULATORY_CARE_PROVIDER_SITE_OTHER): Payer: Managed Care, Other (non HMO) | Admitting: Cardiovascular Disease

## 2017-10-22 ENCOUNTER — Encounter: Payer: Self-pay | Admitting: Cardiovascular Disease

## 2017-10-22 VITALS — BP 118/60 | HR 75 | Ht 70.0 in | Wt 193.0 lb

## 2017-10-22 DIAGNOSIS — K219 Gastro-esophageal reflux disease without esophagitis: Secondary | ICD-10-CM

## 2017-10-22 DIAGNOSIS — R200 Anesthesia of skin: Secondary | ICD-10-CM | POA: Diagnosis not present

## 2017-10-22 DIAGNOSIS — Z8249 Family history of ischemic heart disease and other diseases of the circulatory system: Secondary | ICD-10-CM | POA: Diagnosis not present

## 2017-10-22 DIAGNOSIS — R5383 Other fatigue: Secondary | ICD-10-CM | POA: Insufficient documentation

## 2017-10-22 DIAGNOSIS — R5382 Chronic fatigue, unspecified: Secondary | ICD-10-CM

## 2017-10-22 DIAGNOSIS — F419 Anxiety disorder, unspecified: Secondary | ICD-10-CM | POA: Insufficient documentation

## 2017-10-22 DIAGNOSIS — R079 Chest pain, unspecified: Secondary | ICD-10-CM

## 2017-10-22 HISTORY — DX: Anxiety disorder, unspecified: F41.9

## 2017-10-22 HISTORY — DX: Anesthesia of skin: R20.0

## 2017-10-22 HISTORY — DX: Other fatigue: R53.83

## 2017-10-22 NOTE — Patient Instructions (Addendum)
Medication Instructions:   No medication changes made  Try B12 and Vit D  Labwork:  No new labs needed  Testing/Procedures:  We will order a CT coronary calcium score $150   Follow-Up: It was a pleasure seeing you in the office today. Please call us if you have new issues that need to be addressed before your next appt.  [phone]  Your physician wants you to follow-up in:  As needed  If you need a refill on your cardiac medications before your next appointment, please call your pharmacy.  For educational health videos Log in to : [email or web address] Or : [email or web address], password : [email or web address]

## 2017-10-29 ENCOUNTER — Ambulatory Visit (INDEPENDENT_AMBULATORY_CARE_PROVIDER_SITE_OTHER)
Admission: RE | Admit: 2017-10-29 | Discharge: 2017-10-29 | Disposition: A | Discharge status: Home / Self Care | Payer: Self-pay | Source: Ambulatory Visit | Attending: Cardiovascular Disease | Admitting: Cardiovascular Disease

## 2017-10-29 DIAGNOSIS — Z8249 Family history of ischemic heart disease and other diseases of the circulatory system: Secondary | ICD-10-CM

## 2017-12-17 ENCOUNTER — Encounter: Payer: Self-pay | Admitting: Family Medicine

## 2017-12-17 ENCOUNTER — Ambulatory Visit (INDEPENDENT_AMBULATORY_CARE_PROVIDER_SITE_OTHER): Payer: Managed Care, Other (non HMO) | Admitting: Family Medicine

## 2017-12-17 VITALS — BP 110/72 | HR 76 | Temp 98.7°F | Resp 16 | Wt 192.0 lb

## 2017-12-17 DIAGNOSIS — S3994XS Unspecified injury of external genitals, sequela: Secondary | ICD-10-CM

## 2017-12-17 DIAGNOSIS — R1030 Lower abdominal pain, unspecified: Secondary | ICD-10-CM

## 2017-12-17 DIAGNOSIS — K429 Umbilical hernia without obstruction or gangrene: Secondary | ICD-10-CM | POA: Diagnosis not present

## 2017-12-17 NOTE — Progress Notes (Signed)
Patient: Christopher Shannon Male    DOB: 1961-06-30   57 y.o.   MRN: [id number] Visit Date: 12/17/2017  Today's Provider: Wilhemena Durie, MD   Chief Complaint  Patient presents with  . Follow-up   Subjective:    HPI Pt is here for a follow up from  A traumatic injury to the external genitalia and hernia. He reports that the injury is slowly getting better and he is only taking naproxen OTC now and is only taking it a couple of times a week. It reports that it is most aggravated by physical activity. He also reports that he thinks his hernia is getting worse. He reports that it is an umbilical hernia. He reports that he is still having problems with fatigue and he is having stiffness in his right fifth digit.     No Known Allergies   Current Outpatient Medications:  .  naproxen (NAPROSYN) 500 MG tablet, Take 1 tablet (500 mg total) by mouth 2 (two) times daily with a meal. (Patient taking differently: Take 220 mg by mouth as needed. ), Disp: 60 tablet, Rfl: 2 .  Probiotic Product (PROBIOTIC-10) CAPS, Take by mouth daily., Disp: , Rfl:   Review of Systems  Constitutional: Positive for fatigue.  HENT: Negative.   Eyes: Negative.   Respiratory: Negative.   Cardiovascular: Negative.   Gastrointestinal: Negative.   Endocrine: Negative.   Genitourinary:       Pelvic pain   Musculoskeletal: Positive for arthralgias.  Skin: Negative.   Allergic/Immunologic: Negative.   Neurological: Negative.   Hematological: Negative.   Psychiatric/Behavioral: Negative.     Social History   Tobacco Use  . Smoking status: Never Smoker  . Smokeless tobacco: Never Used  Substance Use Topics  . Alcohol use: No   Objective:   BP 110/72 (BP Location: Left Arm, Patient Position: Sitting, Cuff Size: Normal)   Pulse 76   Temp 98.7 F (37.1 C) (Oral)   Resp 16   Wt 192 lb (87.1 kg)   BMI 27.55 kg/m  Vitals:   12/17/17 0812  BP: 110/72  Pulse: 76  Resp: 16  Temp: 98.7 F  (37.1 C)  TempSrc: Oral  Weight: 192 lb (87.1 kg)     Physical Exam  Constitutional: He is oriented to person, place, and time. He appears well-developed and well-nourished.  HENT:  Head: Normocephalic and atraumatic.  Eyes: No scleral icterus.  Neck: No thyromegaly present.  Cardiovascular: Normal rate, regular rhythm and normal heart sounds.  Pulmonary/Chest: Effort normal and breath sounds normal.  Abdominal: Soft.  Small umbilical hernia.  Lymphadenopathy:    He has no cervical adenopathy.  Neurological: He is alert and oriented to person, place, and time. No cranial nerve deficit. He exhibits normal muscle tone. Coordination normal.  Skin: Skin is warm and dry.  Psychiatric: He has a normal mood and affect. His behavior is normal. Judgment and thought content normal.        Assessment & Plan:     1. Traumatic injury of external genitalia, sequela Clinically should be resolved. He has seen urology. - Ambulatory referral to Physical Therapy  2. Umbilical hernia without obstruction and without gangrene  - Ambulatory referral to General Surgery - Ambulatory referral to Physical Therapy  3. Inguinal pain, unspecified laterality May need ortho/sprorts medicine referral. - Ambulatory referral to Physical Therapy       Wilhemena Durie, MD  [facility]

## 2017-12-17 NOTE — Patient Instructions (Signed)
Try OTC Tumeric

## 2017-12-22 ENCOUNTER — Ambulatory Visit: Payer: Self-pay | Admitting: Family Medicine

## 2018-01-07 ENCOUNTER — Ambulatory Visit: Payer: Managed Care, Other (non HMO) | Admitting: Surgery

## 2018-01-08 ENCOUNTER — Ambulatory Visit: Payer: Managed Care, Other (non HMO) | Admitting: Surgery

## 2018-01-19 ENCOUNTER — Ambulatory Visit: Payer: Managed Care, Other (non HMO) | Admitting: Surgery

## 2018-02-23 ENCOUNTER — Encounter: Payer: Self-pay | Admitting: Surgery

## 2018-02-23 ENCOUNTER — Ambulatory Visit (INDEPENDENT_AMBULATORY_CARE_PROVIDER_SITE_OTHER): Payer: Managed Care, Other (non HMO) | Admitting: Surgery

## 2018-02-23 VITALS — BP 119/73 | HR 81 | Temp 98.7°F | Ht 70.0 in | Wt 189.0 lb

## 2018-02-23 DIAGNOSIS — K429 Umbilical hernia without obstruction or gangrene: Secondary | ICD-10-CM

## 2018-02-23 NOTE — Patient Instructions (Addendum)
You have requested for your Umbilical Hernia be repaired. This has been scheduled on ________________by Dr. Phoebe Perch at [facility].  Please see your (blue)pre-care sheet for information.  You will need to arrange to be off work for 1-2 weeks but will have to have a lifting restriction of no more than 15 lbs for 6 weeks following your surgery.   Umbilical Hernia, Adult A hernia is a bulge of tissue that pushes through an opening between muscles. An umbilical hernia happens in the abdomen, near the belly button (umbilicus). The hernia may contain tissues from the small intestine, large intestine, or fatty tissue covering the intestines (omentum). Umbilical hernias in adults tend to get worse over time, and they require surgical treatment. There are several types of umbilical hernias. You may have:  A hernia located just above or below the umbilicus (indirect hernia). This is the most common type of umbilical hernia in adults.  A hernia that forms through an opening formed by the umbilicus (direct hernia).  A hernia that comes and goes (reducible hernia). A reducible hernia may be visible only when you strain, lift something heavy, or cough. This type of hernia can be pushed back into the abdomen (reduced).  A hernia that traps abdominal tissue inside the hernia (incarcerated hernia). This type of hernia cannot be reduced.  A hernia that cuts off blood flow to the tissues inside the hernia (strangulated hernia). The tissues can start to die if this happens. This type of hernia requires emergency treatment.  What are the causes? An umbilical hernia happens when tissue inside the abdomen presses on a weak area of the abdominal muscles. What increases the risk? You may have a greater risk of this condition if you:  Are obese.  Have had several pregnancies.  Have a buildup of fluid inside your abdomen (ascites).  Have had surgery that weakens the abdominal muscles.  What are  the signs or symptoms? The main symptom of this condition is a painless bulge at or near the belly button. A reducible hernia may be visible only when you strain, lift something heavy, or cough. Other symptoms may include:  Dull pain.  A feeling of pressure.  Symptoms of a strangulated hernia may include:  Pain that gets increasingly worse.  Nausea and vomiting.  Pain when pressing on the hernia.  Skin over the hernia becoming red or purple.  Constipation.  Blood in the stool.  How is this diagnosed? This condition may be diagnosed based on:  A physical exam. You may be asked to cough or strain while standing. These actions increase the pressure inside your abdomen and force the hernia through the opening in your muscles. Your health care provider may try to reduce the hernia by pressing on it.  Your symptoms and medical history.  How is this treated? Surgery is the only treatment for an umbilical hernia. Surgery for a strangulated hernia is done as soon as possible. If you have a small hernia that is not incarcerated, you may need to lose weight before having surgery. Follow these instructions at home:  Lose weight, if told by your health care provider.  Do not try to push the hernia back in.  Watch your hernia for any changes in color or size. Tell your health care provider if any changes occur.  You may need to avoid activities that increase pressure on your hernia.  Do not lift anything that is heavier than 10 lb (4.5 kg) until your health care  provider says that this is safe.  Take over-the-counter and prescription medicines only as told by your health care provider.  Keep all follow-up visits as told by your health care provider. This is important. Contact a health care provider if:  Your hernia gets larger.  Your hernia becomes painful. Get help right away if:  You develop sudden, severe pain near the area of your hernia.  You have pain as well as nausea  or vomiting.  You have pain and the skin over your hernia changes color.  You develop a fever. This information is not intended to replace advice given to you by your health care provider. Make sure you discuss any questions you have with your health care provider. Document Released: 01/19/2016 Document Revised: 04/21/2016 Document Reviewed: 01/19/2016 Elsevier Interactive Patient Education  [employer].

## 2018-02-23 NOTE — Progress Notes (Signed)
Christopher Shannon is an 57 y.o. male.   Chief Complaint: Umbilical hernia HPI: This a patient referred over by [facility] family practice for an umbilical hernia.  Past Medical History:  Diagnosis Date  . Anxiety 10/22/2017  . Arm numbness 10/22/2017  . Arthritis   . Benign prostatic hyperplasia without urinary obstruction 10/24/2006  . Fatigue 10/22/2017  . GERD 07/06/2010   Qualifier: Diagnosis of  By: Loanne Drilling MD, Jacelyn Pi   . GERD (gastroesophageal reflux disease)   . Hiatal hernia   . Irritable bowel syndrome with constipation 01/04/2015   Worse after intentional wt loss.   . Joint pain   . Umbilical hernia without obstruction and without gangrene 04/15/2017    Past Surgical History:  Procedure Laterality Date  . COLONOSCOPY  2011  . NO PAST SURGERIES    . UPPER GI ENDOSCOPY      Family History  Problem Relation Age of Onset  . Lupus Mother   . Stomach cancer Mother   . Thyroid disease Mother   . Heart disease Father   . Asthma Son    Social History:  reports that he has never smoked. He has never used smokeless tobacco. He reports that he does not drink alcohol or use drugs.  Allergies: No Known Allergies   (Not in a hospital admission)   Review of Systems:   Review of Systems  Constitutional: Negative.   HENT: Negative.   Eyes: Negative.   Respiratory: Negative.   Cardiovascular: Negative.   Gastrointestinal: Positive for abdominal pain. Negative for blood in stool, constipation, diarrhea, heartburn, melena, nausea and vomiting.  Genitourinary: Negative.   Musculoskeletal: Negative.   Skin: Negative.   Neurological: Negative.   Endo/Heme/Allergies: Negative.   Psychiatric/Behavioral: Negative.     Physical Exam:  Physical Exam  Constitutional: He is oriented to person, place, and time. He appears well-nourished. No distress.  HENT:  Head: Normocephalic and atraumatic.  Eyes: Pupils are equal, round, and reactive to light. EOM are normal. Right eye  exhibits no discharge. Left eye exhibits no discharge. No scleral icterus.  Neck: Normal range of motion. Neck supple.  Cardiovascular: Normal rate, regular rhythm and normal heart sounds.  No murmur heard. Pulmonary/Chest: Effort normal and breath sounds normal. No stridor. No respiratory distress. He has no wheezes.  Abdominal: Soft. He exhibits no distension and no mass. There is no tenderness. There is no rebound and no guarding. A hernia is present.  Small umbilical hernia measuring about 2-1/2 cm which is completely reducible and nontender Rectus diastases is present.  Musculoskeletal: Normal range of motion. He exhibits no edema or deformity.  Neurological: He is alert and oriented to person, place, and time.  Skin: Skin is warm and dry. No rash noted. He is not diaphoretic. No erythema.  Vitals reviewed.   There were no vitals taken for this visit.    No results found for this or any previous visit (from the past 48 hour(s)). No results found.   Assessment/Plan Umbilical hernia and rectus diastases.  Recommend repair of the umbilical hernia and this will require mesh as to its large size.  Measuring over 2-1/2 cm.  The rationale for this was discussed the options of observation reviewed the risk of bleeding infection recurrence and cosmetic deformity were all discussed.  The rectus diastases would not be addressed.  Multiple questions were answered for him as well as expected postoperative course.  Florene Glen, MD, FACS

## 2018-02-24 ENCOUNTER — Ambulatory Visit: Payer: Managed Care, Other (non HMO) | Admitting: Physical Therapy

## 2018-02-26 ENCOUNTER — Telehealth: Payer: Self-pay | Admitting: Surgery

## 2018-02-26 NOTE — Telephone Encounter (Signed)
Pt advised of pre op date/time and sx date. Sx: 04/01/18 with Dr Dalene Seltzer umbilical hernia repair with mesh.   Pre op: 03/25/18 between 1-5:00pm--phone interview.   Patient made aware to call [phone], between 1-3:00pm the day before surgery, to find out what time to arrive.

## 2018-03-19 ENCOUNTER — Telehealth: Payer: Self-pay | Admitting: Surgery

## 2018-03-19 NOTE — Telephone Encounter (Signed)
Patient has called and stated that he would like to cancel his surgery at this time. Reason: in the process of putting his house on the market. Patient has made an office visit to see Dr Burt Knack again in August to discuss another surgery date.   Open umbilical hernia repair scheduled for 04/01/18 has been cancelled. [facility] OR has been advised.

## 2018-03-25 ENCOUNTER — Inpatient Hospital Stay: Admission: RE | Admit: 2018-03-25 | Payer: Managed Care, Other (non HMO) | Source: Ambulatory Visit

## 2018-04-01 ENCOUNTER — Ambulatory Visit: Admit: 2018-04-01 | Payer: Managed Care, Other (non HMO) | Admitting: Surgery

## 2018-04-01 SURGERY — REPAIR, HERNIA, UMBILICAL, ADULT
Anesthesia: General

## 2018-04-06 ENCOUNTER — Encounter: Payer: Managed Care, Other (non HMO) | Admitting: Family Medicine

## 2018-04-14 ENCOUNTER — Ambulatory Visit: Payer: Managed Care, Other (non HMO) | Admitting: Physical Therapy

## 2018-04-20 ENCOUNTER — Encounter: Payer: Self-pay | Admitting: Surgery

## 2018-04-20 ENCOUNTER — Ambulatory Visit (INDEPENDENT_AMBULATORY_CARE_PROVIDER_SITE_OTHER): Payer: Managed Care, Other (non HMO) | Admitting: Surgery

## 2018-04-20 ENCOUNTER — Telehealth: Payer: Self-pay

## 2018-04-20 VITALS — BP 137/82 | HR 73 | Temp 97.7°F | Wt 186.0 lb

## 2018-04-20 DIAGNOSIS — K429 Umbilical hernia without obstruction or gangrene: Secondary | ICD-10-CM

## 2018-04-20 NOTE — Progress Notes (Signed)
Outpatient Surgical Follow Up  04/20/2018  Christopher Shannon is an 57 y.o. male.   CC:[facility]  HPI: Patient has minimal pain and discomfort from this umbilical hernia of long-standing nature.  He has had no nausea vomiting fevers or chills.  He did have this scheduled back in July but decided against the surgery because he was moving and wanted to perform those duties first.  Other changes to his medical problem list surgical history review of systems. 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 Past Medical History:  Diagnosis Date  . Anxiety 10/22/2017  . Arm numbness 10/22/2017  . Arthritis   . Benign prostatic hyperplasia without urinary obstruction 10/24/2006  . Fatigue 10/22/2017  . GERD 07/06/2010   Qualifier: Diagnosis of  By: Loanne Drilling MD, Jacelyn Pi   . GERD (gastroesophageal reflux disease)   . Hiatal hernia   . Irritable bowel syndrome with constipation 01/04/2015   Worse after intentional wt loss.   . Joint pain   . Umbilical hernia without obstruction and without gangrene 04/15/2017    Past Surgical History:  Procedure Laterality Date  . COLONOSCOPY  2011  . NO PAST SURGERIES    . UPPER GI ENDOSCOPY      Family History  Problem Relation Age of Onset  . Lupus Mother   . Stomach cancer Mother   . Thyroid disease Mother   . Heart disease Father   . Asthma Son     Social History:  reports that he has never smoked. He has never used smokeless tobacco. He reports that he does not drink alcohol or use drugs.  Allergies: No Known Allergies  Medications reviewed.   Review of Systems:   Review of Systems  All other systems reviewed and are negative.    Physical Exam:  There were no vitals taken for this visit.  Physical Exam  Constitutional: He is oriented to person, place, and time. He appears well-developed and well-nourished. No distress.  HENT:  Head: Normocephalic and atraumatic.  Eyes: Pupils are equal, round, and reactive to light. Right eye exhibits no  discharge. Left eye exhibits no discharge. No scleral icterus.  Neck: Normal range of motion. Neck supple.  Cardiovascular: Normal rate and regular rhythm.  Pulmonary/Chest: Effort normal and breath sounds normal. No respiratory distress.  Abdominal: Soft. He exhibits no distension. There is no tenderness. There is no guarding. A hernia is present.  Rectus diastases Small umbilical hernia reducible and nontender  Musculoskeletal: Normal range of motion. He exhibits no edema.  Neurological: He is oriented to person, place, and time.  Skin: Skin is warm and dry. He is not diaphoretic.  Psychiatric: He has a normal mood and affect. Judgment normal.  Vitals reviewed.     No results found for this or any previous visit (from the past 48 hour(s)). No results found.  Assessment/Plan:  Small umbilical hernia that requires repair due to the patient's discomfort and his desire to have it repaired at an earlier age.  He is had minimal symptoms from it.  It is fairly large and may require mesh.  I discussed with him the rationale for exploration of the potential for mesh placement versus primary repair.  We also discussed risk of bleeding infection mesh placement mesh re-recurrence and recurrent hernia.  As well as cosmetic deformity.  He understood and agreed to proceed.  Florene Glen, MD, FACS

## 2018-04-20 NOTE — Telephone Encounter (Signed)
The patient is scheduled for surgery at [facility] by Dr Burt Knack on 04/30/18. He will pre admit by phone. The patient is aware of date and instructions.

## 2018-04-20 NOTE — Telephone Encounter (Signed)
The patient called back and would like to reschedule his surgery. He is now scheduled for surgery at [facility] on 05/20/18. He will pre admit by phone.

## 2018-04-20 NOTE — Patient Instructions (Addendum)
You have requested for your Umbilical Hernia be repaired. This has been scheduled on 05/20/2018 by Dr. Phoebe Perch at [facility].   Please see your (blue)pre-care sheet for information.  You will need to arrange to be off work for 1-2 weeks but will have to have a lifting restriction of no more than 15 lbs for 6 weeks following your surgery.   Umbilical Hernia, Adult A hernia is a bulge of tissue that pushes through an opening between muscles. An umbilical hernia happens in the abdomen, near the belly button (umbilicus). The hernia may contain tissues from the small intestine, large intestine, or fatty tissue covering the intestines (omentum). Umbilical hernias in adults tend to get worse over time, and they require surgical treatment. There are several types of umbilical hernias. You may have:  A hernia located just above or below the umbilicus (indirect hernia). This is the most common type of umbilical hernia in adults.  A hernia that forms through an opening formed by the umbilicus (direct hernia).  A hernia that comes and goes (reducible hernia). A reducible hernia may be visible only when you strain, lift something heavy, or cough. This type of hernia can be pushed back into the abdomen (reduced).  A hernia that traps abdominal tissue inside the hernia (incarcerated hernia). This type of hernia cannot be reduced.  A hernia that cuts off blood flow to the tissues inside the hernia (strangulated hernia). The tissues can start to die if this happens. This type of hernia requires emergency treatment.  What are the causes? An umbilical hernia happens when tissue inside the abdomen presses on a weak area of the abdominal muscles. What increases the risk? You may have a greater risk of this condition if you:  Are obese.  Have had several pregnancies.  Have a buildup of fluid inside your abdomen (ascites).  Have had surgery that weakens the abdominal muscles.  What are the  signs or symptoms? The main symptom of this condition is a painless bulge at or near the belly button. A reducible hernia may be visible only when you strain, lift something heavy, or cough. Other symptoms may include:  Dull pain.  A feeling of pressure.  Symptoms of a strangulated hernia may include:  Pain that gets increasingly worse.  Nausea and vomiting.  Pain when pressing on the hernia.  Skin over the hernia becoming red or purple.  Constipation.  Blood in the stool.  How is this diagnosed? This condition may be diagnosed based on:  A physical exam. You may be asked to cough or strain while standing. These actions increase the pressure inside your abdomen and force the hernia through the opening in your muscles. Your health care provider may try to reduce the hernia by pressing on it.  Your symptoms and medical history.  How is this treated? Surgery is the only treatment for an umbilical hernia. Surgery for a strangulated hernia is done as soon as possible. If you have a small hernia that is not incarcerated, you may need to lose weight before having surgery. Follow these instructions at home:  Lose weight, if told by your health care provider.  Do not try to push the hernia back in.  Watch your hernia for any changes in color or size. Tell your health care provider if any changes occur.  You may need to avoid activities that increase pressure on your hernia.  Do not lift anything that is heavier than 10 lb (4.5 kg) until your  health care provider says that this is safe.  Take over-the-counter and prescription medicines only as told by your health care provider.  Keep all follow-up visits as told by your health care provider. This is important. Contact a health care provider if:  Your hernia gets larger.  Your hernia becomes painful. Get help right away if:  You develop sudden, severe pain near the area of your hernia.  You have pain as well as nausea or  vomiting.  You have pain and the skin over your hernia changes color.  You develop a fever. This information is not intended to replace advice given to you by your health care provider. Make sure you discuss any questions you have with your health care provider. Document Released: 01/19/2016 Document Revised: 04/21/2016 Document Reviewed: 01/19/2016 Elsevier Interactive Patient Education  [employer].

## 2018-04-22 ENCOUNTER — Inpatient Hospital Stay: Admission: RE | Admit: 2018-04-22 | Payer: Managed Care, Other (non HMO) | Source: Ambulatory Visit

## 2018-05-13 ENCOUNTER — Encounter
Admission: RE | Admit: 2018-05-13 | Discharge: 2018-05-13 | Disposition: A | Discharge status: Home / Self Care | Payer: Managed Care, Other (non HMO) | Source: Ambulatory Visit | Attending: Surgery | Admitting: Surgery

## 2018-05-13 ENCOUNTER — Other Ambulatory Visit: Payer: Self-pay

## 2018-05-13 DIAGNOSIS — Z01812 Encounter for preprocedural laboratory examination: Secondary | ICD-10-CM | POA: Insufficient documentation

## 2018-05-13 NOTE — Patient Instructions (Signed)
Your procedure is scheduled on: 05-20-18 [facility] Report to Same Day Surgery 2nd floor medical mall [facility] Entrance-take elevator on left to 2nd floor.  Check in with surgery information desk.) To find out your arrival time please call [phone] between 1PM - 3PM on 05-19-18 TUESDAY  Remember: Instructions that are not followed completely may result in serious medical risk, up to and including death, or upon the discretion of your surgeon and anesthesiologist your surgery may need to be rescheduled.    _x___ 1. Do not eat food after midnight the night before your procedure. NO GUM OR CANDY AFTER MIDNIGHT.  You may drink clear liquids up to 2 hours before you are scheduled to arrive at the hospital for your procedure.  Do not drink clear liquids within 2 hours of your scheduled arrival to the hospital.  Clear liquids include  --Water or Apple juice without pulp  --Clear carbohydrate beverage such as ClearFast or Gatorade  --Black Coffee or Clear Tea (No milk, no creamers, do not add anything to the coffee or Tea   ____Ensure clear carbohydrate drink on the way to the hospital for bariatric patients  ____Ensure clear carbohydrate drink 3 hours before surgery for Dr Dwyane Luo patients if physician instructed.     __x__ 2. No Alcohol for 24 hours before or after surgery.   __x__3. No Smoking or e-cigarettes for 24 prior to surgery.  Do not use any chewable tobacco products for at least 6 hour prior to surgery   ____  4. Bring all medications with you on the day of surgery if instructed.    __x__ 5. Notify your doctor if there is any change in your medical condition     (cold, fever, infections).    x___6. On the morning of surgery brush your teeth with toothpaste and water.  You may rinse your mouth with mouth wash if you wish.  Do not swallow any toothpaste or mouthwash.   Do not wear jewelry, make-up, hairpins, clips or nail polish.  Do not wear lotions, powders, or perfumes.  You may wear deodorant.  Do not shave 48 hours prior to surgery. Men may shave face and neck.  Do not bring valuables to the hospital.    [facility] is not responsible for any belongings or valuables.               Contacts, dentures or bridgework may not be worn into surgery.  Leave your suitcase in the car. After surgery it may be brought to your room.  For patients admitted to the hospital, discharge time is determined by your treatment team.  _  Patients discharged the day of surgery will not be allowed to drive home.  You will need someone to drive you home and stay with you the night of your procedure.    Please read over the following fact sheets that you were given:   [facility] Preparing for Surgery  ____ Take anti-hypertensive listed below, cardiac, seizure, asthma, anti-reflux and psychiatric medicines. These include:  1. NONE  2.  3.  4.  5.  6.  ____Fleets enema or Magnesium Citrate as directed.   _x___ Use CHG Soap or sage wipes as directed on instruction sheet   ____ Use inhalers on the day of surgery and bring to hospital day of surgery  ____ Stop Metformin and Janumet 2 days prior to surgery.    ____ Take 1/2 of usual insulin dose the night before surgery and none on  the morning  surgery.   ____ Follow recommendations from Cardiologist, Pulmonologist or PCP regarding stopping Aspirin, Coumadin, Plavix ,Eliquis, Effient, or Pradaxa, and Pletal.  X____Stop Anti-inflammatories such as Advil, Aleve, Ibuprofen, Motrin, Naproxen, Naprosyn, Goodies powders or aspirin products NOW-OK to take Tylenol    ____ Stop supplements until after surgery.     ____ Bring C-Pap to the hospital.

## 2018-05-14 ENCOUNTER — Encounter
Admission: RE | Admit: 2018-05-14 | Discharge: 2018-05-14 | Disposition: A | Discharge status: Home / Self Care | Payer: Managed Care, Other (non HMO) | Source: Ambulatory Visit | Attending: Surgery | Admitting: Surgery

## 2018-05-14 DIAGNOSIS — K429 Umbilical hernia without obstruction or gangrene: Secondary | ICD-10-CM

## 2018-05-14 DIAGNOSIS — Z01812 Encounter for preprocedural laboratory examination: Secondary | ICD-10-CM | POA: Diagnosis present

## 2018-05-14 LAB — CBC WITH DIFFERENTIAL/PLATELET
Basophils Absolute: 0 10*3/uL (ref 0–0.1)
Basophils Relative: 0 %
Eosinophils Absolute: 0.2 10*3/uL (ref 0–0.7)
Eosinophils Relative: 3 %
HEMATOCRIT: 43.5 % (ref 40.0–52.0)
Hemoglobin: 14.9 g/dL (ref 13.0–18.0)
LYMPHS ABS: 1.4 10*3/uL (ref 1.0–3.6)
Lymphocytes Relative: 25 %
MCH: 29.5 pg (ref 26.0–34.0)
MCHC: 34.1 g/dL (ref 32.0–36.0)
MCV: 86.6 fL (ref 80.0–100.0)
Monocytes Absolute: 0.5 10*3/uL (ref 0.2–1.0)
Monocytes Relative: 8 %
NEUTROS ABS: 3.5 10*3/uL (ref 1.4–6.5)
Neutrophils Relative %: 64 %
Platelets: 208 10*3/uL (ref 150–440)
RBC: 5.03 MIL/uL (ref 4.40–5.90)
RDW: 14.5 % (ref 11.5–14.5)
WBC: 5.6 10*3/uL (ref 3.8–10.6)

## 2018-05-14 LAB — BASIC METABOLIC PANEL
Anion gap: 7 (ref 5–15)
BUN: 17 mg/dL (ref 6–20)
CHLORIDE: 108 mmol/L (ref 98–111)
CO2: 26 mmol/L (ref 22–32)
Calcium: 8.8 mg/dL — ABNORMAL LOW (ref 8.9–10.3)
Creatinine, Ser: 1.01 mg/dL (ref 0.61–1.24)
GFR calc non Af Amer: >60 mL/min (ref >60)
Glucose, Bld: 137 mg/dL — ABNORMAL HIGH (ref 70–99)
Potassium: 3.7 mmol/L (ref 3.5–5.1)
Sodium: 141 mmol/L (ref 135–145)

## 2018-05-14 MED ORDER — CHLORHEXIDINE GLUCONATE CLOTH 2 % EX PADS
6.0000 | MEDICATED_PAD | Freq: Once | CUTANEOUS | Status: DC
  Filled 2018-05-14: qty 6

## 2018-05-19 MED ORDER — CEFAZOLIN SODIUM-DEXTROSE 2-4 GM/100ML-% IV SOLN
2.0000 g | INTRAVENOUS | Status: AC
  Administered 2018-05-20: 2 g via INTRAVENOUS

## 2018-05-19 NOTE — Pre-Procedure Instructions (Signed)
Christopher Merritts, MD  Physician  Cardiology  Progress Notes  Signed  Encounter Date:  10/22/2017          Signed      Expand All Collapse All    Show:Clear all [x] Manual[x] Template[x] Copied  Added by: [x] Christopher Merritts, MD  [] Hover for details cardiology Office Note  Date:  10/22/2017   ID:  Christopher Shannon, Christopher Shannon 16-Sep-1960, MRN [id number]  PCP:  Christopher Banana., MD              Chief Complaint  Patient presents with  . Other    Referred by Dr. Rosanna Shannon for family history of heart disease. Patient c/o chest discomfort and numbness in hands and arms when lying down. Meds reviewed verbally with patient.    HPI:  [name] is a 57 yo male with history of hyperlipidemia Referred by Dr. Rosanna Shannon for symptoms of chest discomfort, numbness in his hands and arms  He is concerned about Family hx of early CAD, CHF  He has good exercise tolerance, no chest pain on exertion Sometimes at rest will have chest tightness, dull ache, not intense  Arms will sometimes go numb when he lays on his side in bed No regular exercise program, but reports no limitations   He is concerned about chronic fatigue.  Reports that he sleeps relatively well   Other past medical includes umbilical hernia,abdominal discomfort and lower extremity discomfort.  06/02/17 for abdominal muscle strain.  Stress test 2007  Total chol 169, LDL 102 on no medication Non-smoker, no diabetes  EKG personally reviewed by myself on todays visit Shows normal sinus rhythm with rate 75 bpm no significant ST or T wave changes   PMH:   has a past medical history of Arthritis, GERD (gastroesophageal reflux disease), Hiatal hernia, and Joint pain.  PSH:         Past Surgical History:  Procedure Laterality Date  . COLONOSCOPY  2011  . NO PAST SURGERIES    . UPPER GI ENDOSCOPY            Current Outpatient Medications  Medication Sig Dispense Refill  . naproxen  (NAPROSYN) 500 MG tablet Take 1 tablet (500 mg total) by mouth 2 (two) times daily with a meal. (Patient taking differently: Take 500 mg by mouth as needed. ) 60 tablet 2  . Probiotic Product (PROBIOTIC-10) CAPS Take by mouth daily.     No current facility-administered medications for this visit.      Allergies:   Patient has no known allergies.   Social History:  The patient  reports that  has never smoked. he has never used smokeless tobacco. He reports that he does not drink alcohol or use drugs.   Family History:   family history includes Asthma in his son; Heart disease in his father; Lupus in his mother; Stomach cancer in his mother; Thyroid disease in his mother.    Review of Systems: Review of Systems  Constitutional: Negative.   Respiratory: Negative.   Cardiovascular: Positive for chest pain.  Gastrointestinal: Negative.   Musculoskeletal: Negative.   Neurological: Negative.   Psychiatric/Behavioral: The patient is nervous/anxious.   All other systems reviewed and are negative.    PHYSICAL EXAM: VS:  BP 118/60 (BP Location: Left Arm, Patient Position: Sitting, Cuff Size: Normal)   Pulse 75   Ht 5\' 10"  (1.778 m)   Wt 193 lb (87.5 kg)   BMI 27.69 kg/m  , BMI Body mass index is  27.69 kg/m. GEN: Well nourished, well developed, in no acute distress  HEENT: normal  Neck: no JVD, carotid bruits, or masses Cardiac: RRR; no murmurs, rubs, or gallops,no edema  Respiratory:  clear to auscultation bilaterally, normal work of breathing GI: soft, nontender, nondistended, + BS MS: no deformity or atrophy  Skin: warm and dry, no rash Neuro:  Strength and sensation are intact Psych: euthymic mood, full affect    Recent Labs: 04/02/2017: ALT 21; BUN 12; Creatinine, Ser 1.09; Hemoglobin 15.2; Platelets 214; Potassium 4.4; Sodium 139; TSH 3.890    Lipid Panel RecentLabs       Lab Results  Component Value Date   CHOL 169 04/02/2017   HDL 47 04/02/2017    LDLCALC 102 (H) 04/02/2017   TRIG 101 04/02/2017           Wt Readings from Last 3 Encounters:  10/22/17 193 lb (87.5 kg)  10/13/17 185 lb (83.9 kg)  09/22/17 197 lb (89.4 kg)       ASSESSMENT AND PLAN:  Family history of heart disease - Plan: EKG 12-Lead, CT CARDIAC SCORING Long discussion, recommended CT coronary calcium scoring for risk stratification No significant risk factors, non-smoker, no diabetes, cholesterol is very reasonable Stress test would likely be unrevealing  Chest pain, unspecified type Atypical in nature, presenting at rest Denies any symptoms with exertion Recommend he start regular exercise program  Anxiety Numerous issues to discuss today including fatigue, chest pain, pulsation in his head, numbness in his arms among other things  Arm numbness Likely positional in nature, less likely circulatory as he was concerned about Good extremity pulses  Gastroesophageal reflux disease without esophagitis Reports symptoms are stable  Fatigue Recommended regular exercise program, make sure he is sleeping well  Could try B12 and vitamin D  Disposition:   F/U as needed  Patient was seen in consultation for Dr. Rosanna Shannon and will be referred back to his office for ongoing care of the issues detailed above   Total encounter time more than 60 minutes  Greater than 50% was spent in counseling and coordination of care with the patient       Orders Placed This Encounter  Procedures  . CT CARDIAC SCORING  . EKG 12-Lead     Signed, Christopher Shannon, M.D., Ph.D. 10/22/2017  [place], [place]         Electronically signed by Christopher Merritts, MD at 10/22/2017 7:16 PM     Office Visit on 10/22/2017       Detailed Report

## 2018-05-20 ENCOUNTER — Other Ambulatory Visit: Payer: Self-pay

## 2018-05-20 ENCOUNTER — Encounter
Admission: RE | Disposition: A | Discharge status: Home / Self Care | Payer: Self-pay | Source: Ambulatory Visit | Attending: Surgery

## 2018-05-20 ENCOUNTER — Ambulatory Visit
Admission: RE | Admit: 2018-05-20 | Discharge: 2018-05-20 | Disposition: A | Discharge status: Home / Self Care | Payer: Managed Care, Other (non HMO) | Source: Ambulatory Visit | Attending: Surgery | Admitting: Surgery

## 2018-05-20 ENCOUNTER — Ambulatory Visit: Payer: Managed Care, Other (non HMO) | Admitting: Anesthesiology

## 2018-05-20 ENCOUNTER — Encounter: Payer: Self-pay | Admitting: *Deleted

## 2018-05-20 DIAGNOSIS — Z800 Family history of malignant neoplasm of digestive organs: Secondary | ICD-10-CM | POA: Diagnosis not present

## 2018-05-20 DIAGNOSIS — N400 Enlarged prostate without lower urinary tract symptoms: Secondary | ICD-10-CM | POA: Insufficient documentation

## 2018-05-20 DIAGNOSIS — K219 Gastro-esophageal reflux disease without esophagitis: Secondary | ICD-10-CM | POA: Insufficient documentation

## 2018-05-20 DIAGNOSIS — Z825 Family history of asthma and other chronic lower respiratory diseases: Secondary | ICD-10-CM | POA: Insufficient documentation

## 2018-05-20 DIAGNOSIS — M1990 Unspecified osteoarthritis, unspecified site: Secondary | ICD-10-CM | POA: Insufficient documentation

## 2018-05-20 DIAGNOSIS — F419 Anxiety disorder, unspecified: Secondary | ICD-10-CM | POA: Insufficient documentation

## 2018-05-20 DIAGNOSIS — K589 Irritable bowel syndrome without diarrhea: Secondary | ICD-10-CM | POA: Insufficient documentation

## 2018-05-20 DIAGNOSIS — K449 Diaphragmatic hernia without obstruction or gangrene: Secondary | ICD-10-CM | POA: Diagnosis not present

## 2018-05-20 DIAGNOSIS — Z8349 Family history of other endocrine, nutritional and metabolic diseases: Secondary | ICD-10-CM | POA: Diagnosis not present

## 2018-05-20 DIAGNOSIS — K429 Umbilical hernia without obstruction or gangrene: Secondary | ICD-10-CM | POA: Diagnosis not present

## 2018-05-20 DIAGNOSIS — Z8489 Family history of other specified conditions: Secondary | ICD-10-CM | POA: Insufficient documentation

## 2018-05-20 HISTORY — PX: UMBILICAL HERNIA REPAIR: SHX196

## 2018-05-20 SURGERY — REPAIR, HERNIA, UMBILICAL, ADULT
Anesthesia: General

## 2018-05-20 MED ORDER — BUPIVACAINE-EPINEPHRINE (PF) 0.25% -1:200000 IJ SOLN
INTRAMUSCULAR | Status: AC
  Filled 2018-05-20: qty 30

## 2018-05-20 MED ORDER — DEXAMETHASONE SODIUM PHOSPHATE 10 MG/ML IJ SOLN
INTRAMUSCULAR | Status: AC
  Filled 2018-05-20: qty 1

## 2018-05-20 MED ORDER — SUCCINYLCHOLINE CHLORIDE 20 MG/ML IJ SOLN
INTRAMUSCULAR | Status: AC
  Filled 2018-05-20: qty 1

## 2018-05-20 MED ORDER — ACETAMINOPHEN 10 MG/ML IV SOLN
INTRAVENOUS | Status: DC | PRN
  Administered 2018-05-20: 1000 mg via INTRAVENOUS

## 2018-05-20 MED ORDER — HEPARIN SODIUM (PORCINE) 5000 UNIT/ML IJ SOLN
5000.0000 [IU] | Freq: Once | INTRAMUSCULAR | Status: AC
  Administered 2018-05-20: 5000 [IU] via SUBCUTANEOUS

## 2018-05-20 MED ORDER — ONDANSETRON HCL 4 MG/2ML IJ SOLN
INTRAMUSCULAR | Status: AC
  Filled 2018-05-20: qty 2

## 2018-05-20 MED ORDER — PROPOFOL 10 MG/ML IV BOLUS
INTRAVENOUS | Status: DC | PRN
  Administered 2018-05-20: 140 mg via INTRAVENOUS

## 2018-05-20 MED ORDER — ROCURONIUM BROMIDE 50 MG/5ML IV SOLN
INTRAVENOUS | Status: AC
  Filled 2018-05-20: qty 1

## 2018-05-20 MED ORDER — KETOROLAC TROMETHAMINE 30 MG/ML IJ SOLN
INTRAMUSCULAR | Status: DC | PRN
  Administered 2018-05-20: 30 mg via INTRAVENOUS

## 2018-05-20 MED ORDER — OXYCODONE-ACETAMINOPHEN 5-325 MG PO TABS
ORAL_TABLET | ORAL | Status: AC
  Filled 2018-05-20: qty 1

## 2018-05-20 MED ORDER — FENTANYL CITRATE (PF) 100 MCG/2ML IJ SOLN
25.0000 ug | INTRAMUSCULAR | Status: DC | PRN
  Administered 2018-05-20 (×4): 25 ug via INTRAVENOUS

## 2018-05-20 MED ORDER — MIDAZOLAM HCL 2 MG/2ML IJ SOLN
INTRAMUSCULAR | Status: DC | PRN
  Administered 2018-05-20: 2 mg via INTRAVENOUS

## 2018-05-20 MED ORDER — OXYCODONE-ACETAMINOPHEN 5-325 MG PO TABS
1.0000 | ORAL_TABLET | ORAL | Status: DC | PRN
  Administered 2018-05-20: 1 via ORAL

## 2018-05-20 MED ORDER — BUPIVACAINE-EPINEPHRINE (PF) 0.25% -1:200000 IJ SOLN
INTRAMUSCULAR | Status: DC | PRN
  Administered 2018-05-20: 30 mL

## 2018-05-20 MED ORDER — SUGAMMADEX SODIUM 500 MG/5ML IV SOLN
INTRAVENOUS | Status: AC
  Filled 2018-05-20: qty 5

## 2018-05-20 MED ORDER — HEPARIN SODIUM (PORCINE) 5000 UNIT/ML IJ SOLN
INTRAMUSCULAR | Status: AC
  Administered 2018-05-20: 5000 [IU] via SUBCUTANEOUS
  Filled 2018-05-20: qty 1

## 2018-05-20 MED ORDER — OXYCODONE-ACETAMINOPHEN 5-325 MG PO TABS: 1.0000 | ORAL_TABLET | ORAL | 0 refills | Status: DC | PRN

## 2018-05-20 MED ORDER — CEFAZOLIN SODIUM-DEXTROSE 2-4 GM/100ML-% IV SOLN
INTRAVENOUS | Status: AC
  Filled 2018-05-20: qty 100

## 2018-05-20 MED ORDER — ROCURONIUM BROMIDE 100 MG/10ML IV SOLN
INTRAVENOUS | Status: DC | PRN
  Administered 2018-05-20: 40 mg via INTRAVENOUS

## 2018-05-20 MED ORDER — LACTATED RINGERS IV SOLN
INTRAVENOUS | Status: DC
  Administered 2018-05-20: 09:00:00 via INTRAVENOUS

## 2018-05-20 MED ORDER — FENTANYL CITRATE (PF) 100 MCG/2ML IJ SOLN
INTRAMUSCULAR | Status: DC | PRN
  Administered 2018-05-20: 100 ug via INTRAVENOUS

## 2018-05-20 MED ORDER — PROPOFOL 10 MG/ML IV BOLUS
INTRAVENOUS | Status: AC
  Filled 2018-05-20: qty 20

## 2018-05-20 MED ORDER — FAMOTIDINE 20 MG PO TABS
ORAL_TABLET | ORAL | Status: AC
  Administered 2018-05-20: 20 mg via ORAL
  Filled 2018-05-20: qty 1

## 2018-05-20 MED ORDER — GLYCOPYRROLATE 0.2 MG/ML IJ SOLN
INTRAMUSCULAR | Status: AC
  Filled 2018-05-20: qty 1

## 2018-05-20 MED ORDER — SUGAMMADEX SODIUM 500 MG/5ML IV SOLN
INTRAVENOUS | Status: DC | PRN
  Administered 2018-05-20: 350 mg via INTRAVENOUS

## 2018-05-20 MED ORDER — ONDANSETRON HCL 4 MG/2ML IJ SOLN: 4.0000 mg | Freq: Once | INTRAMUSCULAR | Status: DC | PRN

## 2018-05-20 MED ORDER — FENTANYL CITRATE (PF) 100 MCG/2ML IJ SOLN
INTRAMUSCULAR | Status: AC
  Filled 2018-05-20: qty 2

## 2018-05-20 MED ORDER — LIDOCAINE HCL (CARDIAC) PF 100 MG/5ML IV SOSY
PREFILLED_SYRINGE | INTRAVENOUS | Status: DC | PRN
  Administered 2018-05-20: 100 mg via INTRAVENOUS

## 2018-05-20 MED ORDER — MIDAZOLAM HCL 2 MG/2ML IJ SOLN
INTRAMUSCULAR | Status: AC
  Filled 2018-05-20: qty 2

## 2018-05-20 MED ORDER — KETOROLAC TROMETHAMINE 30 MG/ML IJ SOLN
INTRAMUSCULAR | Status: AC
  Filled 2018-05-20: qty 1

## 2018-05-20 MED ORDER — FAMOTIDINE 20 MG PO TABS
20.0000 mg | ORAL_TABLET | Freq: Once | ORAL | Status: AC
  Administered 2018-05-20: 20 mg via ORAL

## 2018-05-20 MED ORDER — ACETAMINOPHEN 10 MG/ML IV SOLN
INTRAVENOUS | Status: AC
  Filled 2018-05-20: qty 100

## 2018-05-20 MED ORDER — DEXAMETHASONE SODIUM PHOSPHATE 10 MG/ML IJ SOLN
INTRAMUSCULAR | Status: DC | PRN
  Administered 2018-05-20: 10 mg via INTRAVENOUS

## 2018-05-20 MED ORDER — ONDANSETRON HCL 4 MG/2ML IJ SOLN
INTRAMUSCULAR | Status: DC | PRN
  Administered 2018-05-20: 4 mg via INTRAVENOUS

## 2018-05-20 MED ORDER — LIDOCAINE HCL (PF) 2 % IJ SOLN
INTRAMUSCULAR | Status: AC
  Filled 2018-05-20: qty 10

## 2018-05-20 MED ORDER — GLYCOPYRROLATE 0.2 MG/ML IJ SOLN
INTRAMUSCULAR | Status: DC | PRN
  Administered 2018-05-20: 0.2 mg via INTRAVENOUS

## 2018-05-20 SURGICAL SUPPLY — 31 items
ADH LQ OCL WTPRF AMP STRL LF (MISCELLANEOUS) ×1
ADHESIVE MASTISOL STRL (MISCELLANEOUS) ×3 IMPLANT
CANISTER SUCT 1200ML W/VALVE (MISCELLANEOUS) ×3 IMPLANT
CHLORAPREP W/TINT 26ML (MISCELLANEOUS) ×3 IMPLANT
CLOSURE WOUND 1/2 X4 (GAUZE/BANDAGES/DRESSINGS) ×1
DRAPE LAPAROTOMY 100X77 ABD (DRAPES) ×3 IMPLANT
ELECT REM PT RETURN 9FT ADLT (ELECTROSURGICAL) ×3
ELECTRODE REM PT RTRN 9FT ADLT (ELECTROSURGICAL) ×1 IMPLANT
GLOVE BIO SURGEON STRL SZ8 (GLOVE) ×6 IMPLANT
GOWN STRL REUS W/ TWL LRG LVL3 (GOWN DISPOSABLE) ×2 IMPLANT
GOWN STRL REUS W/TWL LRG LVL3 (GOWN DISPOSABLE) ×6
LABEL OR SOLS (LABEL) ×3 IMPLANT
MESH VENTRALEX ST 2.5 CRC MED (Mesh General) ×2 IMPLANT
NEEDLE HYPO 22GX1.5 SAFETY (NEEDLE) ×3 IMPLANT
NS IRRIG 500ML POUR BTL (IV SOLUTION) ×3 IMPLANT
PACK BASIN MINOR ARMC (MISCELLANEOUS) ×3 IMPLANT
SPONGE GAUZE 2X2 8PLY STER LF (GAUZE/BANDAGES/DRESSINGS) ×2
SPONGE GAUZE 2X2 8PLY STRL LF (GAUZE/BANDAGES/DRESSINGS) ×4 IMPLANT
SPONGE LAP 18X18 RF (DISPOSABLE) ×3 IMPLANT
STRIP CLOSURE SKIN 1/2X4 (GAUZE/BANDAGES/DRESSINGS) ×2 IMPLANT
SUT ETHIBOND 0 (SUTURE) ×1 IMPLANT
SUT ETHIBOND NAB CT1 #1 30IN (SUTURE) ×1 IMPLANT
SUT MNCRL 4-0 (SUTURE) ×3
SUT MNCRL 4-0 27XMFL (SUTURE) ×1
SUT PROLENE 0 CT 1 30 (SUTURE) ×4 IMPLANT
SUT PROLENE 0 CT 1 CR/8 (SUTURE) ×2 IMPLANT
SUT PROLENE 1 CT 1 30 (SUTURE) ×4 IMPLANT
SUT VIC AB 3-0 SH 27 (SUTURE) ×3
SUT VIC AB 3-0 SH 27X BRD (SUTURE) ×1 IMPLANT
SUTURE MNCRL 4-0 27XMF (SUTURE) ×1 IMPLANT
SYR 10ML LL (SYRINGE) ×3 IMPLANT

## 2018-05-20 NOTE — Op Note (Signed)
Abdominal Hernia Repair  Pre-operative Diagnosis: Umbilical hernia  Post-operative Diagnosis: Umbilical hernia  Surgeon: Jerrol Banana. Burt Knack, MD FACS  Anesthesia: Gen. with endotracheal tube  Assistant: Surgical tech  Procedure: Periumbilical hernia with mesh  Procedure Details  The patient was seen again in the Holding Room. The benefits, complications, treatment options, and expected outcomes were discussed with the patient. The risks of bleeding, infection, recurrence of symptoms, failure to resolve symptoms, bowel injury, mesh placement, mesh infection, any of which could require further surgery were reviewed with the patient. The likelihood of improving the patient's symptoms with return to their baseline status is good.  The patient and/or family concurred with the proposed plan, giving informed consent.  The patient was taken to Operating Room, identified as Christopher Shannon and the procedure verified.  A Time Out was held and the above information confirmed.  Prior to the induction of general anesthesia, antibiotic prophylaxis was administered. VTE prophylaxis was in place. General endotracheal anesthesia was then administered and tolerated well. After the induction, the abdomen was prepped with Chloraprep and draped in the sterile fashion. The patient was positioned in the supine position.  A curvilinear incision was made in the infraumbilical area and dissection down to the fascia and hernia sac was performed.  The hernia sac was reduced and the fascial edges were cleaned.  The preperitoneal space was developed with blunt finger dissection.  The rent measured approximately 3 cm.  A 6 cm ventral X patch with codeine was placed into the abdominal cavity and held in with U sutures of 0 Prolene.  Fascia was able to be closed over the top of the mesh.  This was done after the tail were trimmed.  Additional Marcaine for a total of 30 cc was infiltrated in the skin is obtains tissues.   Then 3-0 Vicryl was utilized to tack the skin of the umbilicus back to the fascia.  Deep sutures of 3-0 Vicryl placed followed by 4 oh septic or Monocryl.  Steri-Strips Mastisol sterile dressings were placed  Patient taught the procedure well there were no complications he was taken to recovery room in stable condition to be discharged care of his family follow-up in 10 days  Findings:  3 cm umbilical rent with 6 cm ventral X patch  Estimated Blood Loss: Minimal         Drains: None         Specimens: None       Complications: none               Sarann Tregre E. Burt Knack, MD, FACS

## 2018-05-20 NOTE — Transfer of Care (Signed)
Immediate Anesthesia Transfer of Care Note  Patient: Christopher Shannon [facility]  Procedure(s) Performed: Procedure(s): HERNIA REPAIR UMBILICAL ADULT (N/A)  Patient Location: PACU  Anesthesia Type:General  Level of Consciousness: sedated  Airway & Oxygen Therapy: Patient Spontanous Breathing and Patient connected to face mask oxygen  Post-op Assessment: Report given to RN and Post -op Vital signs reviewed and stable  Post vital signs: Reviewed and stable  Last Vitals:  Vitals:   05/20/18 0859 05/20/18 1149  BP: 133/85 125/80  Pulse: 76 62  Resp: 18 13  Temp: (!) 36.3 C (!) 36.2 C  SpO2: 648% 472%    Complications: No apparent anesthesia complications

## 2018-05-20 NOTE — Discharge Instructions (Signed)
Remove dressing in 24 hours. °May shower in 24 hours. °Leave paper strips in place. °Resume all home medications. °Follow-up with Dr. Cooper in 10 days. ° °AMBULATORY SURGERY  °DISCHARGE INSTRUCTIONS ° ° °1) The drugs that you were given will stay in your system until tomorrow so for the next 24 hours you should not: ° °A) Drive an automobile °B) Make any legal decisions °C) Drink any alcoholic beverage ° ° °2) You may resume regular meals tomorrow.  Today it is better to start with liquids and gradually work up to solid foods. ° °You may eat anything you prefer, but it is better to start with liquids, then soup and crackers, and gradually work up to solid foods. ° ° °3) Please notify your doctor immediately if you have any unusual bleeding, trouble breathing, redness and pain at the surgery site, drainage, fever, or pain not relieved by medication. ° ° ° °4) Additional Instructions: ° ° ° ° ° ° ° °Please contact your physician with any problems or Same Day Surgery at [phone], Monday through Friday 6 am to 4 pm, or [place] at  Main number at [phone]. °

## 2018-05-20 NOTE — Anesthesia Preprocedure Evaluation (Addendum)
Anesthesia Evaluation  Patient identified by MRN, date of birth, ID band Patient awake    Reviewed: Allergy & Precautions, NPO status , Patient's Chart, lab work & pertinent test results  History of Anesthesia Complications Negative for: history of anesthetic complications  Airway Mallampati: II       Dental   Pulmonary neg sleep apnea, neg COPD,           Cardiovascular (-) hypertension(-) Past MI and (-) CHF (-) dysrhythmias (-) Valvular Problems/Murmurs     Neuro/Psych neg Seizures Anxiety    GI/Hepatic Neg liver ROS, hiatal hernia, GERD (rare)  ,  Endo/Other  neg diabetesHypothyroidism (borderline, no meds)   Renal/GU negative Renal ROS     Musculoskeletal   Abdominal   Peds  Hematology   Anesthesia Other Findings   Reproductive/Obstetrics                            Anesthesia Physical Anesthesia Plan  ASA: II  Anesthesia Plan: General   Post-op Pain Management:    Induction: Intravenous  PONV Risk Score and Plan: 2 and Dexamethasone and Ondansetron  Airway Management Planned: Oral ETT  Additional Equipment:   Intra-op Plan:   Post-operative Plan:   Informed Consent: I have reviewed the patients History and Physical, chart, labs and discussed the procedure including the risks, benefits and alternatives for the proposed anesthesia with the patient or authorized representative who has indicated his/her understanding and acceptance.     Plan Discussed with:   Anesthesia Plan Comments:         Anesthesia Quick Evaluation

## 2018-05-20 NOTE — Anesthesia Postprocedure Evaluation (Signed)
Anesthesia Post Note  Patient: Christopher Shannon [facility]  Procedure(s) Performed: HERNIA REPAIR UMBILICAL ADULT (N/A )  Patient location during evaluation: PACU Anesthesia Type: General Level of consciousness: awake and alert Pain management: pain level controlled Vital Signs Assessment: post-procedure vital signs reviewed and stable Respiratory status: spontaneous breathing and respiratory function stable Cardiovascular status: stable Anesthetic complications: no     Last Vitals:  Vitals:   05/20/18 1219 05/20/18 1225  BP: 105/80   Pulse: 64 (!) 56  Resp: 13 13  Temp:    SpO2: 100%     Last Pain:  Vitals:   05/20/18 1230  TempSrc:   PainSc: 4                  KEPHART,WILLIAM K

## 2018-05-20 NOTE — Anesthesia Post-op Follow-up Note (Signed)
Anesthesia QCDR form completed.        

## 2018-05-20 NOTE — Progress Notes (Signed)
Preoperative Review   Patient is met in the preoperative holding area. The history is reviewed in the chart and with the patient. I personally reviewed the options and rationale as well as the risks of this procedure that have been previously discussed with the patient. All questions asked by the patient and/or family were answered to their satisfaction.  Patient agrees to proceed with this procedure at this time.  Elaf Clauson E Leauna Sharber M.D. FACS  

## 2018-05-20 NOTE — Anesthesia Procedure Notes (Signed)
Procedure Name: Intubation Date/Time: 05/20/2018 11:09 AM Performed by: Doreen Salvage, CRNA Pre-anesthesia Checklist: Patient identified, Patient being monitored, Timeout performed, Emergency Drugs available and Suction available Patient Re-evaluated:Patient Re-evaluated prior to induction Oxygen Delivery Method: Circle system utilized Preoxygenation: Pre-oxygenation with 100% oxygen Induction Type: IV induction Ventilation: Mask ventilation without difficulty Laryngoscope Size: Mac and 4 Grade View: Grade III Tube type: Oral Tube size: 7.5 mm Number of attempts: 1 Airway Equipment and Method: Stylet Placement Confirmation: ETT inserted through vocal cords under direct vision,  positive ETCO2 and breath sounds checked- equal and bilateral Secured at: 23 cm Tube secured with: Tape Dental Injury: Teeth and Oropharynx as per pre-operative assessment  Difficulty Due To: Difficult Airway- due to anterior larynx

## 2018-05-21 ENCOUNTER — Encounter: Payer: Self-pay | Admitting: Surgery

## 2018-05-25 ENCOUNTER — Encounter: Payer: Self-pay | Admitting: Surgery

## 2018-05-25 ENCOUNTER — Ambulatory Visit (INDEPENDENT_AMBULATORY_CARE_PROVIDER_SITE_OTHER): Payer: Managed Care, Other (non HMO) | Admitting: Surgery

## 2018-05-25 VITALS — BP 120/78 | HR 74 | Temp 97.7°F | Ht 69.0 in | Wt 185.0 lb

## 2018-05-25 DIAGNOSIS — K429 Umbilical hernia without obstruction or gangrene: Secondary | ICD-10-CM

## 2018-05-25 NOTE — Patient Instructions (Signed)
Return as needed. No heavy lifting for the next couple of weeks.

## 2018-05-25 NOTE — Progress Notes (Signed)
Outpatient postop visit  05/25/2018  Christopher Shannon is an 57 y.o. male.    Procedure: Umbilical hernia repair with mesh  CC: No problems HPI: This patient underwent an umbilical hernia repair with mesh. Patient has minimal pain around the incision but is not taking any narcotics at this point. Medications reviewed.    Physical Exam:  There were no vitals taken for this visit.    PE: Wound is clean no erythema no drainage minimal ecchymosis.  Tender.    Assessment/Plan:  Status post umbilical hernia repair with mesh. Wound is doing well.  Reminded of no heavy lifting for another 2 weeks.  He will follow-up on an as-needed basis.    Florene Glen, MD, FACS

## 2018-06-09 ENCOUNTER — Ambulatory Visit: Payer: Managed Care, Other (non HMO) | Admitting: Physical Therapy

## 2018-06-16 ENCOUNTER — Encounter: Payer: Managed Care, Other (non HMO) | Admitting: Physical Therapy

## 2018-06-22 ENCOUNTER — Encounter: Payer: Managed Care, Other (non HMO) | Admitting: Physical Therapy

## 2018-07-02 ENCOUNTER — Ambulatory Visit (INDEPENDENT_AMBULATORY_CARE_PROVIDER_SITE_OTHER): Payer: Managed Care, Other (non HMO) | Admitting: Family Medicine

## 2018-07-02 VITALS — BP 100/76 | HR 72 | Temp 98.2°F | Resp 16 | Ht 69.0 in | Wt 187.0 lb

## 2018-07-02 DIAGNOSIS — Z125 Encounter for screening for malignant neoplasm of prostate: Secondary | ICD-10-CM

## 2018-07-02 DIAGNOSIS — Z1211 Encounter for screening for malignant neoplasm of colon: Secondary | ICD-10-CM | POA: Diagnosis not present

## 2018-07-02 DIAGNOSIS — Z0000 Encounter for general adult medical examination without abnormal findings: Secondary | ICD-10-CM

## 2018-07-02 LAB — POCT URINALYSIS DIPSTICK
BILIRUBIN UA: NEGATIVE
GLUCOSE UA: NEGATIVE
Ketones, UA: NEGATIVE
Leukocytes, UA: NEGATIVE
Nitrite, UA: NEGATIVE
Protein, UA: NEGATIVE
RBC UA: NEGATIVE
Spec Grav, UA: 1.02 (ref 1.010–1.025)
Urobilinogen, UA: 0.2 E.U./dL
pH, UA: 5 (ref 5.0–8.0)

## 2018-07-02 LAB — IFOBT (OCCULT BLOOD): IFOBT: NEGATIVE

## 2018-07-02 NOTE — Progress Notes (Signed)
Patient: Christopher Shannon, Male    DOB: 1961-07-07, 57 y.o.   MRN: [id number] Visit Date: 07/02/2018  Today's Provider: Wilhemena Durie, MD   Chief Complaint  Patient presents with  . Annual Exam   Subjective:  Christopher Shannon is a 57 y.o. male who presents today for health maintenance and complete physical. He feels well. He reports exercising none. He reports he is sleeping well.  06/11/2010 Colonoscopy-repeat 10 years  Review of Systems  Constitutional: Positive for fatigue.  HENT: Negative.   Eyes: Positive for itching.  Respiratory: Negative.   Cardiovascular: Negative.   Gastrointestinal: Negative.   Endocrine: Negative.   Genitourinary: Positive for enuresis, genital sores, scrotal swelling and testicular pain.  Musculoskeletal: Positive for neck stiffness.  Skin: Negative.   Allergic/Immunologic: Negative.   Neurological: Negative.   Hematological: Negative.   Psychiatric/Behavioral: Negative.     Social History   Socioeconomic History  . Marital status: Married    Spouse name: Not on file  . Number of children: Not on file  . Years of education: Not on file  . Highest education level: Not on file  Occupational History  . Not on file  Social Needs  . Financial resource strain: Not on file  . Food insecurity:    Worry: Not on file    Inability: Not on file  . Transportation needs:    Medical: Not on file    Non-medical: Not on file  Tobacco Use  . Smoking status: Never Smoker  . Smokeless tobacco: Never Used  Substance and Sexual Activity  . Alcohol use: No  . Drug use: No  . Sexual activity: Not on file  Lifestyle  . Physical activity:    Days per week: Not on file    Minutes per session: Not on file  . Stress: Not on file  Relationships  . Social connections:    Talks on phone: Not on file    Gets together: Not on file    Attends religious service: Not on file    Active member of club or organization: Not on file    Attends  meetings of clubs or organizations: Not on file    Relationship status: Not on file  . Intimate partner violence:    Fear of current or ex partner: Not on file    Emotionally abused: Not on file    Physically abused: Not on file    Forced sexual activity: Not on file  Other Topics Concern  . Not on file  Social History Narrative  . Not on file    Patient Active Problem List   Diagnosis Date Noted  . Chest pain 10/22/2017  . Anxiety 10/22/2017  . Arm numbness 10/22/2017  . Fatigue 10/22/2017  . Muscle weakness 09/17/2017  . Umbilical hernia without obstruction and without gangrene 04/15/2017  . Irritable bowel syndrome with constipation 01/04/2015  . HYPOTHYROIDISM 07/06/2010  . DYSLIPIDEMIA 07/06/2010  . GERD 07/06/2010  . Pruritic disorder 07/22/2007  . Benign prostatic hyperplasia without urinary obstruction 10/24/2006    Past Surgical History:  Procedure Laterality Date  . COLONOSCOPY  2011  . TONSILLECTOMY     AGE 41  . UMBILICAL HERNIA REPAIR N/A 05/20/2018   Procedure: HERNIA REPAIR UMBILICAL ADULT;  Surgeon: Florene Glen, MD;  Location: [facility] ORS;  Service: General;  Laterality: N/A;  . UPPER GI ENDOSCOPY      His family history includes Asthma in his son; Heart disease in his father; Lupus in  his mother; Stomach cancer in his mother; Thyroid disease in his mother.     No outpatient encounter medications on file as of 07/02/2018.   No facility-administered encounter medications on file as of 07/02/2018.     Patient Care Team: Jerrol Banana., MD as PCP - General (Family Medicine) Jerrol Banana., MD (Family Medicine) Bary Castilla Forest Gleason, MD (General Surgery)      Objective:   Vitals:  Vitals:   07/02/18 0921  BP: 100/76  Pulse: 72  Resp: 16  Temp: 98.2 F (36.8 C)  TempSrc: Oral  SpO2: 98%  Weight: 187 lb (84.8 kg)  Height: 5\' 9"  (1.753 m)    Physical Exam  Constitutional: He is oriented to person, place, and time. He appears  well-developed and well-nourished.  HENT:  Head: Normocephalic and atraumatic.  Right Ear: External ear normal.  Left Ear: External ear normal.  Nose: Nose normal.  Mouth/Throat: Oropharynx is clear and moist.  Eyes: Pupils are equal, round, and reactive to light. Conjunctivae and EOM are normal.  Neck: Normal range of motion. Neck supple.  Cardiovascular: Normal rate, regular rhythm, normal heart sounds and intact distal pulses.  Pulmonary/Chest: Effort normal and breath sounds normal.  Abdominal: Soft. Bowel sounds are normal.  Genitourinary: Rectum normal, prostate normal and penis normal. Rectal exam shows guaiac negative stool.  Genitourinary Comments: Completely normal GU exam.  Musculoskeletal: Normal range of motion.  Neurological: He is alert and oriented to person, place, and time.  Skin: Skin is warm and dry.  Psychiatric: He has a normal mood and affect. His behavior is normal. Judgment and thought content normal.     Depression Screen PHQ 2/9 Scores 07/02/2018 04/02/2017 04/01/2016  PHQ - 2 Score 0 0 0  PHQ- 9 Score - 2 -      Assessment & Plan:     Routine Health Maintenance and Physical Exam  Exercise Activities and Dietary recommendations Goals   None     Immunization History  Administered Date(s) Administered  . Td 11/12/2003, 12/03/2011    Health Maintenance  Topic Date Due  . HIV Screening  11/16/1974  . INFLUENZA VACCINE  04/02/2018  . COLONOSCOPY  06/11/2020  . TETANUS/TDAP  12/02/2021  . Hepatitis C Screening  Completed     Discussed health benefits of physical activity, and encouraged him to engage in regular exercise appropriate for his age and condition.    1. Annual physical exam  - CBC with Differential/Platelet - Comprehensive metabolic panel - Lipid Panel With LDL/HDL Ratio - TSH - POCT urinalysis dipstick  2. Prostate cancer screening  - PSA  3. Colon cancer screening  - IFOBT POC (occult bld, rslt in office)  HPI,  Exam and A&P Transcribed under the direction and in the presence of Miguel Aschoff, Brooke Bonito., MD. Electronically Signed: Althea Charon, RMA  I have done the exam and reviewed the chart and it is accurate to the best of my knowledge. Development worker, community has been used and  any errors in dictation or transcription are unintentional. Miguel Aschoff M.D. [facility]

## 2018-07-03 LAB — COMPREHENSIVE METABOLIC PANEL
A/G RATIO: 2.3 — AB (ref 1.2–2.2)
ALBUMIN: 4.4 g/dL (ref 3.5–5.5)
ALT: 20 IU/L (ref 0–44)
AST: 16 IU/L (ref 0–40)
Alkaline Phosphatase: 54 IU/L (ref 39–117)
BILIRUBIN TOTAL: 0.6 mg/dL (ref 0.0–1.2)
BUN/Creatinine Ratio: 11 (ref 9–20)
BUN: 13 mg/dL (ref 6–24)
CHLORIDE: 102 mmol/L (ref 96–106)
CO2: 24 mmol/L (ref 20–29)
Calcium: 9.3 mg/dL (ref 8.7–10.2)
Creatinine, Ser: 1.15 mg/dL (ref 0.76–1.27)
GFR, EST AFRICAN AMERICAN: 81 mL/min/{1.73_m2} (ref >59)
GFR, EST NON AFRICAN AMERICAN: 70 mL/min/{1.73_m2} (ref >59)
Globulin, Total: 1.9 g/dL (ref 1.5–4.5)
Glucose: 92 mg/dL (ref 65–99)
POTASSIUM: 4.4 mmol/L (ref 3.5–5.2)
Sodium: 141 mmol/L (ref 134–144)
TOTAL PROTEIN: 6.3 g/dL (ref 6.0–8.5)

## 2018-07-03 LAB — CBC WITH DIFFERENTIAL/PLATELET
BASOS: 0 %
Basophils Absolute: 0 10*3/uL (ref 0.0–0.2)
EOS (ABSOLUTE): 0.2 10*3/uL (ref 0.0–0.4)
EOS: 3 %
HEMOGLOBIN: 15.3 g/dL (ref 13.0–17.7)
Hematocrit: 46.6 % (ref 37.5–51.0)
IMMATURE GRANS (ABS): 0 10*3/uL (ref 0.0–0.1)
Immature Granulocytes: 0 %
LYMPHS: 25 %
Lymphocytes Absolute: 2 10*3/uL (ref 0.7–3.1)
MCH: 28.1 pg (ref 26.6–33.0)
MCHC: 32.8 g/dL (ref 31.5–35.7)
MCV: 86 fL (ref 79–97)
MONOCYTES: 7 %
Monocytes Absolute: 0.6 10*3/uL (ref 0.1–0.9)
NEUTROS ABS: 5.3 10*3/uL (ref 1.4–7.0)
Neutrophils: 65 %
Platelets: 238 10*3/uL (ref 150–450)
RBC: 5.44 x10E6/uL (ref 4.14–5.80)
RDW: 13.9 % (ref 12.3–15.4)
WBC: 8.1 10*3/uL (ref 3.4–10.8)

## 2018-07-03 LAB — LIPID PANEL WITH LDL/HDL RATIO
Cholesterol, Total: 199 mg/dL (ref 100–199)
HDL: 46 mg/dL (ref >39)
LDL Calculated: 123 mg/dL — ABNORMAL HIGH (ref 0–99)
LDL/HDL RATIO: 2.7 ratio (ref 0.0–3.6)
Triglycerides: 152 mg/dL — ABNORMAL HIGH (ref 0–149)
VLDL Cholesterol Cal: 30 mg/dL (ref 5–40)

## 2018-07-03 LAB — TSH: TSH: 4.75 u[IU]/mL — AB (ref 0.450–4.500)

## 2018-07-03 LAB — PSA: PROSTATE SPECIFIC AG, SERUM: 1.2 ng/mL (ref 0.0–4.0)

## 2018-07-07 ENCOUNTER — Ambulatory Visit: Payer: Managed Care, Other (non HMO) | Admitting: Physical Therapy

## 2018-07-14 ENCOUNTER — Ambulatory Visit: Payer: Managed Care, Other (non HMO) | Admitting: Physical Therapy

## 2018-07-21 ENCOUNTER — Ambulatory Visit: Payer: Managed Care, Other (non HMO) | Admitting: Physical Therapy

## 2018-07-28 ENCOUNTER — Ambulatory Visit: Payer: Managed Care, Other (non HMO) | Admitting: Physical Therapy

## 2018-08-04 ENCOUNTER — Other Ambulatory Visit: Payer: Self-pay

## 2018-08-04 ENCOUNTER — Encounter: Payer: Self-pay | Admitting: Physical Therapy

## 2018-08-04 ENCOUNTER — Ambulatory Visit: Payer: Managed Care, Other (non HMO) | Attending: Family Medicine | Admitting: Physical Therapy

## 2018-08-04 DIAGNOSIS — R2689 Other abnormalities of gait and mobility: Secondary | ICD-10-CM | POA: Insufficient documentation

## 2018-08-04 DIAGNOSIS — M62838 Other muscle spasm: Secondary | ICD-10-CM | POA: Insufficient documentation

## 2018-08-04 DIAGNOSIS — R278 Other lack of coordination: Secondary | ICD-10-CM | POA: Diagnosis present

## 2018-08-04 NOTE — Patient Instructions (Addendum)
  Proper body mechanics with getting out of a chair to decrease strain  on back &pelvic floor   Avoid holding your breath when Getting out of the chair:  Scoot to front part of chair chair Heels behind feet, feet are hip width apart, nose over toes  Inhale like you are smelling roses Exhale to stand     Avoid straining pelvic floor, abdominal muscles , spine  Use log rolling technique instead of getting out of bed with your neck or the sit-up   Log rolling into and out of .bed  With sidelying position first   ________ To take off shoes  Figure-4 stretch:  Turn 45 deg on chair or bench: Crossing ankle on thigh    _______   Water intake:  1 room temp glass before coffee in the morning  Refill on 16 fl oz tumbler x 2 x day   ________  Stretches : (Cuing provided for proper alignment) Every hour at work  hip flexor stretch on step x 5 reps  hip flexor twist on step x 5 reps

## 2018-08-05 NOTE — Therapy (Addendum)
[place] MAIN [facility] SERVICES [address] [place], [place], [address] Phone: [phone]   Fax:  [phone]  Physical Therapy Evaluation  Patient Details  Name: Christopher Shannon MRN: [id number] Date of Birth: 08-21-1961 Referring Provider (PT): Nils Flack Date: 08/04/2018  PT End of Session - 08/05/18 1720    Visit Number  1    Number of Visits  12    Date for PT Re-Evaluation  10/28/18    PT Start Time  6962    PT Stop Time  1805    PT Time Calculation (min)  70 min       Past Medical History:  Diagnosis Date  . Anxiety 10/22/2017  . Arm numbness 10/22/2017  . Arthritis   . Benign prostatic hyperplasia without urinary obstruction 10/24/2006  . Fatigue 10/22/2017  . GERD 07/06/2010   Qualifier: Diagnosis of  By: Loanne Drilling MD, Jacelyn Pi   . GERD (gastroesophageal reflux disease)    NO MEDS  . Hiatal hernia   . Irritable bowel syndrome with constipation 01/04/2015   Worse after intentional wt loss.   . Joint pain   . Umbilical hernia without obstruction and without gangrene 04/15/2017    Past Surgical History:  Procedure Laterality Date  . COLONOSCOPY  2011  . TONSILLECTOMY     AGE 17  . UMBILICAL HERNIA REPAIR N/A 05/20/2018   Procedure: HERNIA REPAIR UMBILICAL ADULT;  Surgeon: Florene Glen, MD;  Location: [facility] ORS;  Service: General;  Laterality: N/A;  . UPPER GI ENDOSCOPY      There were no vitals filed for this visit.   Subjective Assessment - 08/04/18 1702    Subjective 1)  Swelling in the L scrotum and the front of the groin occurred.  Pt had an injury 18 months ago while poaching during tennis. Poaching movement occurs with a movement in stance and quick pivot to the left about 90 deg. During this movement, pt noticed like an "internal belt that broke".  Pt could not run but could move. Pt did not go to the ER.  It was very sore for one week. Denied swelling/ bulging. Pt could not play tennis for 6 months and  could not run.  Pt reported it was not a painful thing but it was more a"non-response" from the muscles. This injury impacted his activities: kneeling on the floor to stand, getting out of car. "The legs don't seem as strong".  Three weeks prior to this tennis injury, pt had another tennis injury and took a direct hit of the tennis ball on his penis. That injury restricted his movement while playing. Denied bruising on penis. Swelling in the L scrotum and the front of the groin occurred. During these injuries, pt felt pain in the inner thighs.  Pt is only able to play tennis 1 x/ week up to 3hrs  instead  3 x week.  Additional Physical activities:  no additional fitness. The penile pain has resolved along with IBS-constipation Sx after umbilical hernia surgery which occurred in Sept 2019.  Pt underwent umbilical hernial repair in Sept 2019 and since then, pt has noticed he no longer has bowel movements once every 4 days and now he is having them every other day and without needing to take colon cleanse formulas.    2) Urinary frequency:  After his 76s, pt has had BPH and he noticed he pees more. Daily intake of water "very little."  One cup of  coffee, small cup of water at lunch.  Pt is afraid of going to the bathroom often if he drinks too much.     3) R knee pain .5/10 which pt twisted when climbing stairs 3 weeks ago.    Pertinent Hx:  stiff back and past ankle sprains bilaterally in the past.        4) numbness/ tingling in B hands with fishing, driving, sleeping,. Been an issue for 20 years    5) tennis elbow pain on R , pain with lifting as light as a coffee cup.      Pertinent History           [facility] PT Assessment - 08/06/18 0001      Assessment   Medical Diagnosis  inguinal pain, umbilicual hernia, traumatic injury to genitalia     Referring Provider (PT)  Rosanna Randy       Precautions   Precautions  None      Restrictions   Weight Bearing Restrictions  No      Balance Screen   Has the  patient fallen in the past 6 months  No      Observation/Other Assessments   Observations  slumped sitting, ankles crossed.  limited pirformis stretch with spinal flexion bilaterally to don shoes        Coordination   Gross Motor Movements are Fluid and Coordinated  --   dyscoordination, abdominal expansion, limited diaphragm     Floor to Stand   Comments  half kneeling, delayed with L as front leg, toes tucked in back, knee alignment proper       AROM   Overall AROM Comments  hip ext B 0 deg       Strength   Overall Strength Comments  BLE gross 5/5        Palpation   Spinal mobility  increased thoracic hypomobility     SI assessment   levelled pelvis    Palpation comment  R scapula delayed in downward rotation . posterior sling strength with perturbation of trunk       Bed Mobility   Bed Mobility  --   crunch               Objective measurements completed on examination: See above findings.    Pelvic Floor Special Questions - 08/05/18 1722    Diastasis Recti  4 fingers width above umbilicus     External Perineal Exam  through clothing: minimal lift with cue for coordination       [facility] Adult PT Treatment/Exercise - 08/05/18 1722      Bed Mobility   Bed Mobility  --   crunch     Neuro Re-ed    Neuro Re-ed Details   see pt instructions                  PT Long Term Goals - 08/06/18 0946      PT LONG TERM GOAL #1   Title  Pt will increase his PSFS score for getting out of car and running 4/10 to > 9/10, standing from kneeling 2/10 to > 8/10 in order to return to functional activities     Time  12    Period  Weeks    Status  New    Target Date  10/29/18      PT LONG TERM GOAL #2   Title  Pt will decrease NIH-CPSI from 30% to < 15% in order to improve pelvic floor function  and decrease pain     Time  10    Period  Weeks    Status  New    Target Date  10/15/18      PT LONG TERM GOAL #3   Title  Pt will demo no delay /difficulty with half  kneeling ( L leg in front) with proper alignment transferring to stand     Time  4    Period  Weeks    Status  New    Target Date  09/03/18      PT LONG TERM GOAL #4   Title  Pt will demo abdominal separation from 4 fingers to < 2 finger in order to improve intraabdominal pressure to decrease worsening of umbilical hernia     Time  8    Period  Weeks    Status  New    Target Date  08/06/18      PT LONG TERM GOAL #5   Title  Pt will demo improved hip flexibility with crossing ankle over opposite thigh to don/doff shoes without spinal flexion to optimize hip mobility for tennis playing and minimize risk for injuries at abdomen and hips     Time  6    Period  Weeks    Status  New    Target Date  09/17/18             Plan - 08/05/18 1720    Clinical Impression Statement  Pt is a 57 yo male who reports swelling in the L scrotum and the front of the groin 2/2 injuries, urinary frequency, R knee pain, and B numbness/ tingling in hands, and R tennis elbow pain. These deficits impact his ability to play tennis in prior level of injuries at groin and to transfer from floor to stand from half kneeling position. Urinary frequency is associated with BPH and poor water intake. R knee pain occurred 3 weeks ago and impacts stair climbing. B numbness/tingling has been chronic issue for 20 years and impacts  fishing, driving, sleeping.  R tennis elbow issues is related to tennis playing which is pt's primary hobby over the years. Pt has a sedentary desk job and lacks other regular physical routine and no stretch routine. Pt also has had history of ankle sprains.   Pt 's clinical presentations include limited spinal mobility, diastasis recti, dyscoordination and strength of pelvic floor mm, limited hip mobility, poor body mechanics which places strain on the abdominal/pelvic floor mm. These are deficits that indicate an ineffective intraabdominal pressure system associated with deficits. Pt demo'd  improved body mechanics post Tx. Provided education on the role of PT, importance of well-rounded fitness routine and role of deep core and less straining at abdomen, pelvis, spine in functional tasks to minimize risk for umbilical hernia and diastasis recti and pelvic floor dysfunction. Pt voiced understanding. Pt will benefit from skilled PT    Clinical Presentation  Evolving    Clinical Decision Making  Moderate    Rehab Potential  Good    PT Frequency  1x / week    PT Duration  12 weeks    PT Treatment/Interventions  Stair training;Therapeutic exercise;Therapeutic activities;Balance training;Neuromuscular re-education;Moist Heat;Aquatic Therapy;Patient/family education;Scar mobilization;Manual techniques;Energy conservation;Taping;Manual lymph drainage;Functional mobility training    Consulted and Agree with Plan of Care  Patient       Patient will benefit from skilled therapeutic intervention in order to improve the following deficits and impairments:  Increased muscle spasms, Hypomobility, Improper body mechanics, Impaired sensation,  Increased fascial restricitons, Postural dysfunction, Pain, Decreased strength, Decreased safety awareness, Decreased coordination, Decreased range of motion, Decreased endurance, Decreased balance, Abnormal gait, Decreased mobility  Visit Diagnosis: Other lack of coordination  Other abnormalities of gait and mobility     Problem List Patient Active Problem List   Diagnosis Date Noted  . Chest pain 10/22/2017  . Anxiety 10/22/2017  . Arm numbness 10/22/2017  . Fatigue 10/22/2017  . Muscle weakness 09/17/2017  . Umbilical hernia without obstruction and without gangrene 04/15/2017  . Irritable bowel syndrome with constipation 01/04/2015  . HYPOTHYROIDISM 07/06/2010  . DYSLIPIDEMIA 07/06/2010  . GERD 07/06/2010  . Pruritic disorder 07/22/2007  . Benign prostatic hyperplasia without urinary obstruction 10/24/2006    Jerl Mina ,PT, DPT,  E-RYT  08/06/2018, 10:02 AM  [place] MAIN [facility] SERVICES [address] [place], [place], [address] Phone: [phone]   Fax:  [phone]  Name: Christopher Shannon MRN: [id number] Date of Birth: 12-Jun-1961

## 2018-08-06 NOTE — Addendum Note (Signed)
Addended by: Jerl Mina on: 08/06/2018 10:06 AM   Modules accepted: Orders

## 2018-08-11 ENCOUNTER — Ambulatory Visit: Payer: Managed Care, Other (non HMO) | Admitting: Physical Therapy

## 2018-08-11 DIAGNOSIS — R278 Other lack of coordination: Secondary | ICD-10-CM | POA: Diagnosis not present

## 2018-08-11 DIAGNOSIS — R2689 Other abnormalities of gait and mobility: Secondary | ICD-10-CM

## 2018-08-11 NOTE — Therapy (Signed)
[place] MAIN [facility] SERVICES [address] [place], [place], [address] Phone: [phone]   Fax:  [phone]  Physical Therapy Treatment  Patient Details  Name: Christopher Shannon MRN: [id number] Date of Birth: May 17, 1961 Referring Provider (PT): Rosanna Randy    Encounter Date: 08/11/2018  PT End of Session - 08/11/18 1810    Visit Number  2    Number of Visits  12    Date for PT Re-Evaluation  10/28/18    PT Start Time  1702    PT Stop Time  1811    PT Time Calculation (min)  69 min    Activity Tolerance  Patient tolerated treatment well    Behavior During Therapy  [facility] for tasks assessed/performed       Past Medical History:  Diagnosis Date  . Anxiety 10/22/2017  . Arm numbness 10/22/2017  . Arthritis   . Benign prostatic hyperplasia without urinary obstruction 10/24/2006  . Fatigue 10/22/2017  . GERD 07/06/2010   Qualifier: Diagnosis of  By: Loanne Drilling MD, Jacelyn Pi   . GERD (gastroesophageal reflux disease)    NO MEDS  . Hiatal hernia   . Irritable bowel syndrome with constipation 01/04/2015   Worse after intentional wt loss.   . Joint pain   . Umbilical hernia without obstruction and without gangrene 04/15/2017    Past Surgical History:  Procedure Laterality Date  . COLONOSCOPY  2011  . TONSILLECTOMY     AGE 57  . UMBILICAL HERNIA REPAIR N/A 05/20/2018   Procedure: HERNIA REPAIR UMBILICAL ADULT;  Surgeon: Florene Glen, MD;  Location: [facility] ORS;  Service: General;  Laterality: N/A;  . UPPER GI ENDOSCOPY      There were no vitals filed for this visit.  Subjective Assessment - 08/11/18 1708    Subjective  Pt has been to keep from using his stomach muscles. Pt has increased his water    Pertinent History  Pertinent Hx:  stiff back and past ankle sprains bilaterally in the past.              [facility] PT Assessment - 08/11/18 1748      Coordination   Gross Motor Movements are Fluid and Coordinated  --   limited excursion,  abdominal straining w/ inhalation     Palpation   Spinal mobility  increased thoracic hypomobility , signfiicant tightness with paraspinal mm B                Pelvic Floor Special Questions - 08/11/18 1747    Diastasis Recti  4 fingers width above umbilicus     External Perineal Exam  through briefs,  no tenderness/ tensions noted         [facility] Adult PT Treatment/Exercise - 08/11/18 1748      Therapeutic Activites    Therapeutic Activities  --   see pt instructions      Neuro Re-ed    Neuro Re-ed Details   see pt instructions      Manual Therapy   Manual therapy comments  STM/ MWM PA mobs at throacic segments,  STM at intercostals, fascial release over sternum to promote thorax mobility                   PT Long Term Goals - 08/06/18 0946      PT LONG TERM GOAL #1   Title  Pt will increase his PSFS score for getting out of car and running 4/10 to >  9/10, standing from kneeling 2/10 to > 8/10 in order to return to functional activities     Time  12    Period  Weeks    Status  New    Target Date  10/29/18      PT LONG TERM GOAL #2   Title  Pt will decrease NIH-CPSI from 30% to < 15% in order to improve pelvic floor function and decrease pain     Time  10    Period  Weeks    Status  New    Target Date  10/15/18      PT LONG TERM GOAL #3   Title  Pt will demo no delay /difficulty with half kneeling ( L leg in front) with proper alignment transferring to stand     Time  4    Period  Weeks    Status  New    Target Date  09/03/18      PT LONG TERM GOAL #4   Title  Pt will demo abdominal separation from 4 fingers to < 2 finger in order to improve intraabdominal pressure to decrease worsening of umbilical hernia     Time  8    Period  Weeks    Status  New    Target Date  08/06/18      PT LONG TERM GOAL #5   Title  Pt will demo improved hip flexibility with crossing ankle over opposite thigh to don/doff shoes without spinal flexion to optimize hip  mobility for tennis playing and minimize risk for injuries at abdomen and hips     Time  6    Period  Weeks    Status  New    Target Date  09/17/18            Plan - 08/11/18 1811    Clinical Impression Statement  Pt showed improved diaphragmatic excursion, thoracic/intercostal mobility and decreased back mm tensions. Pt demo'd improved deep core coordination with breathing after excessive cues to not overuse abdominal mm. Pt was educated proper stabilizing ways to perform HEP without worsening diastasis rectus including floor <> stand t/f. Pt demo'd properly. Pt continues to benefit from skilled PT.   Rehab Potential  Good    PT Frequency  1x / week    PT Duration  12 weeks    PT Treatment/Interventions  Stair training;Therapeutic exercise;Therapeutic activities;Balance training;Neuromuscular re-education;Moist Heat;Aquatic Therapy;Patient/family education;Scar mobilization;Manual techniques;Energy conservation;Taping;Manual lymph drainage;Functional mobility training    Consulted and Agree with Plan of Care  Patient        Patient will benefit from skilled therapeutic intervention in order to improve the following deficits and impairments:  Increased muscle spasms, Hypomobility, Improper body mechanics, Impaired sensation, Increased fascial restricitons, Postural dysfunction, Pain, Decreased strength, Decreased safety awareness, Decreased coordination, Decreased range of motion, Decreased endurance, Decreased balance, Abnormal gait, Decreased mobility  Visit Diagnosis: Other lack of coordination  Other abnormalities of gait and mobility     Problem List Patient Active Problem List   Diagnosis Date Noted  . Chest pain 10/22/2017  . Anxiety 10/22/2017  . Arm numbness 10/22/2017  . Fatigue 10/22/2017  . Muscle weakness 09/17/2017  . Umbilical hernia without obstruction and without gangrene 04/15/2017  . Irritable bowel syndrome with constipation 01/04/2015  . HYPOTHYROIDISM  07/06/2010  . DYSLIPIDEMIA 07/06/2010  . GERD 07/06/2010  . Pruritic disorder 07/22/2007  . Benign prostatic hyperplasia without urinary obstruction 10/24/2006    Jerl Mina ,PT, DPT, E-RYT  08/11/2018, 6:13  PM  [place] MAIN [facility] SERVICES [address] [place], [place], [address] Phone: [phone]   Fax:  [phone]  Name: Santana Edell MRN: [id number] Date of Birth: 1960/09/14

## 2018-08-11 NOTE — Patient Instructions (Addendum)
Stretches: morning and night:  5 reps   childs poses rocking Toes tucked, shoulders downa nd back, paws grip , hands shoulder width apart   5 breaths  Figure -4 stretch  Cross over thigh stretch   5 reps of  knee to chest on exhale    Open book ( handout)  ______  Work break from desk every 1 hr   6 directions of the spine   Side bend,  arm swings while keeping hips/ knees in place  Mini squat with hands back to pull, chest lifts     ___     Transition from standing to floor: Wide squat like you are about to pick something up from the floor --> crawl hand down on thigh and then reach other hand onto the ground (all fours)  To get up, all fours--> lifts hips in to Downward Facing Dog  and walk hands backwards to feet --> mini quat --> hands on thighs, then hips then pause HERE  to avoid (moving too quickly up/ blood rush) -->  knees glide forward and roll  Hips up instead of hinging spine up   * KEEP YOUR HEAD AND HEART LEVELLED , NEVER LETTING YOUR HEAD GET BELOW YOUR HEART

## 2018-08-18 ENCOUNTER — Ambulatory Visit: Payer: Managed Care, Other (non HMO) | Admitting: Physical Therapy

## 2018-08-18 DIAGNOSIS — R278 Other lack of coordination: Secondary | ICD-10-CM

## 2018-08-18 DIAGNOSIS — R2689 Other abnormalities of gait and mobility: Secondary | ICD-10-CM

## 2018-08-18 NOTE — Patient Instructions (Addendum)
Work stretches: neck and shoulder stretches  _add doorway stretches:  lunge position with elbows bent, trunk forward forward for pect muscles to stretch   10 reps with each leg forward   _ stand perpendicular to the wall, palm at the wall at 7 o'clock L (5 oclcock for R hand) , let temple touch the wall   _ angel wings from hips to 30 deg with contact of shoulders, elbows, and head against wall _____  At home 2x day   Dragging arms up ( angel wing) Lower down with elbow bent  10 reps feel shoulder blades slide   6 directions of the neck without pillow  3 reps    ____   Work on breathing 20 reps   Inhale, 1-2 pause ribs expand 360 deg  Exhale 2-1 pause ribs lower naturally   Avoid extra effort with forcing the upper abs down    _____    **ALSO SQUEEZE BEFORE YOUR SNEEZE, COUGH, LAUGH to decrease downward pressure   ______  Adjust car seat straight to maintain contact of shoulders and spine to car seat to minimize numbness of hands

## 2018-08-18 NOTE — Therapy (Signed)
[place] MAIN [facility] SERVICES [address] [place], [place], [address] Phone: [phone]   Fax:  [phone]  Physical Therapy Treatment  Patient Details  Name: Christopher Shannon MRN: [id number] Date of Birth: [birth year] Referring Provider (PT): Rosanna Randy    Encounter Date: 08/18/2018  PT End of Session - 08/18/18 1802    Visit Number  3    Number of Visits  12    Date for PT Re-Evaluation  10/28/18    PT Start Time  8466    PT Stop Time  1810    PT Time Calculation (min)  72 min    Activity Tolerance  Patient tolerated treatment well    Behavior During Therapy  [facility] for tasks assessed/performed       Past Medical History:  Diagnosis Date  . Anxiety 10/22/2017  . Arm numbness 10/22/2017  . Arthritis   . Benign prostatic hyperplasia without urinary obstruction 10/24/2006  . Fatigue 10/22/2017  . GERD 07/06/2010   Qualifier: Diagnosis of  By: Loanne Drilling MD, Jacelyn Pi   . GERD (gastroesophageal reflux disease)    NO MEDS  . Hiatal hernia   . Irritable bowel syndrome with constipation 01/04/2015   Worse after intentional wt loss.   . Joint pain   . Umbilical hernia without obstruction and without gangrene 04/15/2017    Past Surgical History:  Procedure Laterality Date  . COLONOSCOPY  2011  . TONSILLECTOMY     AGE 40  . UMBILICAL HERNIA REPAIR N/A 05/20/2018   Procedure: HERNIA REPAIR UMBILICAL ADULT;  Surgeon: Florene Glen, MD;  Location: [facility] ORS;  Service: General;  Laterality: N/A;  . UPPER GI ENDOSCOPY      There were no vitals filed for this visit.  Subjective Assessment - 08/18/18 1657    Subjective  When pt was laying down and coughed, pt felt a rubber band like sensation like it snapped located in the groin by the testicles on L with a connection to the lower abdomen.  Pt is concerned with the numbness he feels in his hands after using the leaf blower, driving .      Pertinent History  Pertinent Hx:  stiff back and past  ankle sprains bilaterally in the past.              [facility] PT Assessment - 08/18/18 1753      Palpation   Spinal mobility  decreased thoracic mm tightness     Palpation comment  B upper trap/ posterior scalenes tight       Umbilicus scar with scar restrictions  Abdominal overuse with downward push during diaphragmatic training         Pelvic Floor Special Questions - 08/18/18 0001    Diastasis Recti  Post Tx :2.5 fingers width         [facility] Adult PT Treatment/Exercise - 08/18/18 1730      Neuro Re-ed    Neuro Re-ed Details   see pt instructions      Manual Therapy   Manual therapy comments  Quadriped: shoulder flexion/ trunk rotation  for abdominal fascial pulling to correct diastasis recti =,  Lower trunk MWM with fascial releases to decrease umbilicus scar restrictions  distraction at occipit, inferior mob Grade III 1st B                 PT Long Term Goals - 08/06/18 0946      PT LONG TERM GOAL #1   Title  Pt will increase his PSFS score for getting out of car and running 4/10 to > 9/10, standing from kneeling 2/10 to > 8/10 in order to return to functional activities     Time  12    Period  Weeks    Status  New    Target Date  10/29/18      PT LONG TERM GOAL #2   Title  Pt will decrease NIH-CPSI from 30% to < 15% in order to improve pelvic floor function and decrease pain     Time  10    Period  Weeks    Status  New    Target Date  10/15/18      PT LONG TERM GOAL #3   Title  Pt will demo no delay /difficulty with half kneeling ( L leg in front) with proper alignment transferring to stand     Time  4    Period  Weeks    Status  New    Target Date  09/03/18      PT LONG TERM GOAL #4   Title  Pt will demo abdominal separation from 4 fingers to < 2 finger in order to improve intraabdominal pressure to decrease worsening of umbilical hernia     Time  8    Period  Weeks    Status  New    Target Date  08/06/18      PT LONG TERM GOAL #5    Title  Pt will demo improved hip flexibility with crossing ankle over opposite thigh to don/doff shoes without spinal flexion to optimize hip mobility for tennis playing and minimize risk for injuries at abdomen and hips     Time  6    Period  Weeks    Status  New    Target Date  09/17/18            Plan - 08/18/18 1807    Clinical Impression Statement  Pt demo'd increased diaphragmatic excursion and improved thoracic mobility compared to last session. Addressed diastasis recti with manual Tx which pt toelrated without increased pain. Pt showed increased linea alba closure post Tx.  Required excessive cues to minimize upper abdominal overuse with deep core coordination.  Addressed neck/ shoulder complaints of numbness in hands by applying manual Tx to minimize tightness at upper trap, posterior scalenes B.  Provided education with anatomy and physiology images to explain relationship of poor posture, overuse of pectoralis mm to numbness in hands. Educated about proper carseat position when driving to optimize scapular stabilization and less shortened position of pect mm. Pt demo'd improved scapular mobility and depression of trap post Tx.  Provided explanation about intraabdomninal pressures system to mitigate pt's concern for groin Sx with coughing and educated co-contraction techinque of pelvic floor during cough.  Pt demo'd correctly. Pt continues to benefit fromskilled PT.     Rehab Potential  Good    PT Frequency  1x / week    PT Duration  12 weeks    PT Treatment/Interventions  Stair training;Therapeutic exercise;Therapeutic activities;Balance training;Neuromuscular re-education;Moist Heat;Aquatic Therapy;Patient/family education;Scar mobilization;Manual techniques;Energy conservation;Taping;Manual lymph drainage;Functional mobility training    Consulted and Agree with Plan of Care  Patient       Patient will benefit from skilled therapeutic intervention in order to improve the following  deficits and impairments:  Increased muscle spasms, Hypomobility, Improper body mechanics, Impaired sensation, Increased fascial restricitons, Postural dysfunction, Pain, Decreased strength, Decreased safety awareness, Decreased coordination, Decreased range of motion,  Decreased endurance, Decreased balance, Abnormal gait, Decreased mobility  Visit Diagnosis: Other lack of coordination  Other abnormalities of gait and mobility     Problem List Patient Active Problem List   Diagnosis Date Noted  . Chest pain 10/22/2017  . Anxiety 10/22/2017  . Arm numbness 10/22/2017  . Fatigue 10/22/2017  . Muscle weakness 09/17/2017  . Umbilical hernia without obstruction and without gangrene 04/15/2017  . Irritable bowel syndrome with constipation 01/04/2015  . HYPOTHYROIDISM 07/06/2010  . DYSLIPIDEMIA 07/06/2010  . GERD 07/06/2010  . Pruritic disorder 07/22/2007  . Benign prostatic hyperplasia without urinary obstruction 10/24/2006    Jerl Mina ,PT, DPT, E-RYT  08/18/2018, 6:20 PM  [place] MAIN [facility] SERVICES [address] [place], [place], [address] Phone: [phone]   Fax:  [phone]  Name: Christopher Shannon MRN: [id number] Date of Birth: [birth year]

## 2018-08-24 ENCOUNTER — Ambulatory Visit: Payer: Managed Care, Other (non HMO) | Admitting: Physical Therapy

## 2018-08-24 DIAGNOSIS — R2689 Other abnormalities of gait and mobility: Secondary | ICD-10-CM

## 2018-08-24 DIAGNOSIS — R278 Other lack of coordination: Secondary | ICD-10-CM

## 2018-08-24 NOTE — Patient Instructions (Addendum)
Emphasizing feet on the ground flat, under knees to maintain spinal curves for shock absorpancy due to gravity  Do not mold to your furniture, make adjustments for dinner table and chairs to maintain feet on the ground, upright sitting for proper jaw and chewing ( either place reams of paper under feet to bring floor to you or buy new table and chairs )    To decrease back tensions: After performing open book and knee to chest _add Side of hip stretch:  Reclined twist for hips and side of the hips/ legs  Lay on your back, knees bend Scoot hips to the R , leave shoulders in place MAKE A POSTERIOR TILT OF PELVIS by lifting hips up and lowering ribs first and then dropping sacrum down with posterior tilt   Drop knees to the L side resting onto pillows to keep leg at the same width of hips Pillow under L thigh to minimize too much strain   Palms by side and look opposite to knees    __  After performing activities at home, also apply stretches   __  Morning and night:  summary   Open book, knee chest, recline twist,  Deep core level 1 ( with ocean sound breath- notice J scoop) not letting upper stomach muscles push down)  10reps   Deep core level 2 ( with ocean sound as well)  6 mins   ___  Squat alignment to minimize knee pain:  4 corners of feet down, butt back to ensure knees behind toes

## 2018-08-24 NOTE — Therapy (Signed)
[place] MAIN [facility] SERVICES [address] [place], [place], [address] Phone: [phone]   Fax:  [phone]  Physical Therapy Treatment  Patient Details  Name: Christopher Shannon MRN: [id number] Date of Birth: 12-12-1960 Referring Provider (PT): Rosanna Randy    Encounter Date: 08/24/2018  PT End of Session - 08/24/18 1512    Visit Number  4    Number of Visits  12    Date for PT Re-Evaluation  10/28/18    PT Start Time  1829    PT Stop Time  1520    PT Time Calculation (min)  75 min    Activity Tolerance  Patient tolerated treatment well    Behavior During Therapy  [facility] for tasks assessed/performed       Past Medical History:  Diagnosis Date  . Anxiety 10/22/2017  . Arm numbness 10/22/2017  . Arthritis   . Benign prostatic hyperplasia without urinary obstruction 10/24/2006  . Fatigue 10/22/2017  . GERD 07/06/2010   Qualifier: Diagnosis of  By: Loanne Drilling MD, Jacelyn Pi   . GERD (gastroesophageal reflux disease)    NO MEDS  . Hiatal hernia   . Irritable bowel syndrome with constipation 01/04/2015   Worse after intentional wt loss.   . Joint pain   . Umbilical hernia without obstruction and without gangrene 04/15/2017    Past Surgical History:  Procedure Laterality Date  . COLONOSCOPY  2011  . TONSILLECTOMY     AGE 57  . UMBILICAL HERNIA REPAIR N/A 05/20/2018   Procedure: HERNIA REPAIR UMBILICAL ADULT;  Surgeon: Florene Glen, MD;  Location: [facility] ORS;  Service: General;  Laterality: N/A;  . UPPER GI ENDOSCOPY      There were no vitals filed for this visit.  Subjective Assessment - 08/24/18 1408    Subjective  Pt reports his neck feels stiff and his numbness in hands were not as bad with leaf blowing compared to last weekend. Two weeks ago, pt had TMJ pain in the ear and towards the ear with chewing. The alignment of his jaw is not back to where it was but the TMJ pain is not has bad.  Pt eats at home at a dinner table which is high and  sits on a high chair with feet not on the floor but on the chair rail.  Pt 's R knee are hurting with the squats     Pertinent History  Pertinent Hx:  stiff back and past ankle sprains bilaterally in the past.              [facility] PT Assessment - 08/24/18 1504      Observation/Other Assessments   Observations  slumped sitting       Coordination   Coordination and Movement Description  excessive oblique overuse w/ exhalation without cranial movemetn ofp elvic floor. Post Tx with excessive tactile, verbal, and visual cues and post Tx to lengthen QL, pt achieved corrected his dyscoordination and straining.       Palpation   Palpation comment  increased tightness at QL, paraspinals, posterior intercostals,.                 Pelvic Floor Special Questions - 08/24/18 1459    Diastasis Recti  Post Tx :2.5 fingers width above umbilicus, Post Tx: 1.5 fingers, less depth          [facility] Adult PT Treatment/Exercise - 08/24/18 1504      Neuro Re-ed    Neuro Re-ed  Details   see pt instructions      Manual Therapy   Manual therapy comments  sidelying, QL release with rocking of thorax anteriorly , lengthening                   PT Long Term Goals - 08/06/18 0946      PT LONG TERM GOAL #1   Title  Pt will increase his PSFS score for getting out of car and running 4/10 to > 9/10, standing from kneeling 2/10 to > 8/10 in order to return to functional activities     Time  12    Period  Weeks    Status  New    Target Date  10/29/18      PT LONG TERM GOAL #2   Title  Pt will decrease NIH-CPSI from 30% to < 15% in order to improve pelvic floor function and decrease pain     Time  10    Period  Weeks    Status  New    Target Date  10/15/18      PT LONG TERM GOAL #3   Title  Pt will demo no delay /difficulty with half kneeling ( L leg in front) with proper alignment transferring to stand     Time  4    Period  Weeks    Status  New    Target Date  09/03/18      PT LONG  TERM GOAL #4   Title  Pt will demo abdominal separation from 4 fingers to < 2 finger in order to improve intraabdominal pressure to decrease worsening of umbilical hernia     Time  8    Period  Weeks    Status  New    Target Date  08/06/18      PT LONG TERM GOAL #5   Title  Pt will demo improved hip flexibility with crossing ankle over opposite thigh to don/doff shoes without spinal flexion to optimize hip mobility for tennis playing and minimize risk for injuries at abdomen and hips     Time  6    Period  Weeks    Status  New    Target Date  09/17/18            Plan - 08/24/18 1515    Clinical Impression Statement  Pt continues to demo improvement with diastasis recti but requried further manual Tx to approximate middle sectionof linea alba. Provided education to minimize injury to umbilical hernia,  prompted less slumped sitting, educated about teh role of spinal curves and cervical alignment for proper chewing and TMJ function. Pt showed improved closure of abdominal muscles but required excessive cues to minizmie overuse of obliques and minimzie downward straining. Pt demo'd proper coordination post Tx. Pt also requried cues to minimzie excessive lumbar lordosis in deep core strengthenign exercise and doorway stretches. Pt continues to benefit from skilled PT.     Rehab Potential  Good    PT Frequency  1x / week    PT Duration  12 weeks    PT Treatment/Interventions  Stair training;Therapeutic exercise;Therapeutic activities;Balance training;Neuromuscular re-education;Moist Heat;Aquatic Therapy;Patient/family education;Scar mobilization;Manual techniques;Energy conservation;Taping;Manual lymph drainage;Functional mobility training    Consulted and Agree with Plan of Care  Patient       Patient will benefit from skilled therapeutic intervention in order to improve the following deficits and impairments:  Increased muscle spasms, Hypomobility, Improper body mechanics, Impaired  sensation, Increased fascial restricitons, Postural dysfunction,  Pain, Decreased strength, Decreased safety awareness, Decreased coordination, Decreased range of motion, Decreased endurance, Decreased balance, Abnormal gait, Decreased mobility  Visit Diagnosis: Other lack of coordination  Other abnormalities of gait and mobility     Problem List Patient Active Problem List   Diagnosis Date Noted  . Chest pain 10/22/2017  . Anxiety 10/22/2017  . Arm numbness 10/22/2017  . Fatigue 10/22/2017  . Muscle weakness 09/17/2017  . Umbilical hernia without obstruction and without gangrene 04/15/2017  . Irritable bowel syndrome with constipation 01/04/2015  . HYPOTHYROIDISM 07/06/2010  . DYSLIPIDEMIA 07/06/2010  . GERD 07/06/2010  . Pruritic disorder 07/22/2007  . Benign prostatic hyperplasia without urinary obstruction 10/24/2006    Jerl Mina ,PT, DPT, E-RYT  08/24/2018, 3:29 PM  [place] MAIN [facility] SERVICES [address] [place], [place], [address] Phone: [phone]   Fax:  [phone]  Name: Christopher Shannon MRN: [id number] Date of Birth: 1961/08/07

## 2018-08-31 ENCOUNTER — Ambulatory Visit: Payer: Managed Care, Other (non HMO) | Admitting: Physical Therapy

## 2018-08-31 DIAGNOSIS — R2689 Other abnormalities of gait and mobility: Secondary | ICD-10-CM

## 2018-08-31 DIAGNOSIS — R278 Other lack of coordination: Secondary | ICD-10-CM | POA: Diagnosis not present

## 2018-08-31 NOTE — Patient Instructions (Addendum)
  _____________________________ Opposite arm   Place band in "U"    band under ballmounds  while laying on back w/ knees bent   20 reps  on each side  Holding band from opposite thigh,  Inhale,   exhale then pull band across body while keeping elbow , shoulders, back of the head pressed down   ______________  Heel raises:   Standing perpendicular to wall: leg closest to wall is the leg you will raise the heel off floor and lower slowly    R 10 reps x 3 x 1 day   Perform calf stretches before and after 10 reps each    ______________  * slightly turned out toes in squat, chin tucked,  Pressing all 4 corners of the foot down on the rise    _____________

## 2018-08-31 NOTE — Therapy (Signed)
[place] MAIN [facility] SERVICES [address] [place], [place], [address] Phone: [phone]   Fax:  [phone]  Physical Therapy Treatment  Patient Details  Name: Christopher Shannon MRN: [id number] Date of Birth: 08-May-1961 Referring Provider (PT): Rosanna Randy    Encounter Date: 08/31/2018  PT End of Session - 08/31/18 1705    Visit Number  5    Number of Visits  12    Date for PT Re-Evaluation  10/28/18    PT Start Time  1702    PT Stop Time  1756    PT Time Calculation (min)  54 min    Activity Tolerance  Patient tolerated treatment well    Behavior During Therapy  [facility] for tasks assessed/performed       Past Medical History:  Diagnosis Date  . Anxiety 10/22/2017  . Arm numbness 10/22/2017  . Arthritis   . Benign prostatic hyperplasia without urinary obstruction 10/24/2006  . Fatigue 10/22/2017  . GERD 07/06/2010   Qualifier: Diagnosis of  By: Loanne Drilling MD, Jacelyn Pi   . GERD (gastroesophageal reflux disease)    NO MEDS  . Hiatal hernia   . Irritable bowel syndrome with constipation 01/04/2015   Worse after intentional wt loss.   . Joint pain   . Umbilical hernia without obstruction and without gangrene 04/15/2017    Past Surgical History:  Procedure Laterality Date  . COLONOSCOPY  2011  . TONSILLECTOMY     AGE 57  . UMBILICAL HERNIA REPAIR N/A 05/20/2018   Procedure: HERNIA REPAIR UMBILICAL ADULT;  Surgeon: Florene Glen, MD;  Location: [facility] ORS;  Service: General;  Laterality: N/A;  . UPPER GI ENDOSCOPY      There were no vitals filed for this visit.  Subjective Assessment - 08/31/18 1705    Subjective  Pt has noticed the groin pain has continued to lessen. The popping is still there and is a weird feeling when he coughs or turn or stretch certain way.     Pertinent History  Pertinent Hx:  stiff back and past ankle sprains bilaterally in the past.              [facility] PT Assessment - 08/31/18 1722      Observation/Other  Assessments   Observations  limited knee ext in calf stretch on R, ( post Tx to mobilize midfoot into DF/ EV: full range )       Posture/Postural Control   Posture Comments  proper pelvic floor coordination without ab straining       Strength   Overall Strength Comments  10 reps Grade 3/5 RLE, 15rep s Grade 4/5 L  Plantarflexion   DF/EV 5/5 , PF/INV 5/5 in open chain     Palpation   Palpation comment  limited mobility on R in cuboid/ medial cuneiform R                 Pelvic Floor Special Questions - 08/31/18 1708    Diastasis Recti  2 fingers below sternum, 1 fingers above umbilicus         [facility] Adult PT Treatment/Exercise - 08/31/18 1722      Neuro Re-ed    Neuro Re-ed Details   see pt instructions      Manual Therapy   Manual therapy comments  distraction of STJ, AP mob at midfoot into DF/EV                  PT Long Term  Goals - 08/06/18 0946      PT LONG TERM GOAL #1   Title  Pt will increase his PSFS score for getting out of car and running 4/10 to > 9/10, standing from kneeling 2/10 to > 8/10 in order to return to functional activities     Time  12    Period  Weeks    Status  New    Target Date  10/29/18      PT LONG TERM GOAL #2   Title  Pt will decrease NIH-CPSI from 30% to < 15% in order to improve pelvic floor function and decrease pain     Time  10    Period  Weeks    Status  New    Target Date  10/15/18      PT LONG TERM GOAL #3   Title  Pt will demo no delay /difficulty with half kneeling ( L leg in front) with proper alignment transferring to stand     Time  4    Period  Weeks    Status  New    Target Date  09/03/18      PT LONG TERM GOAL #4   Title  Pt will demo abdominal separation from 4 fingers to < 2 finger in order to improve intraabdominal pressure to decrease worsening of umbilical hernia     Time  8    Period  Weeks    Status  New    Target Date  08/06/18      PT LONG TERM GOAL #5   Title  Pt will demo improved  hip flexibility with crossing ankle over opposite thigh to don/doff shoes without spinal flexion to optimize hip mobility for tennis playing and minimize risk for injuries at abdomen and hips     Time  6    Period  Weeks    Status  New    Target Date  09/17/18            Plan - 08/31/18 1806    Clinical Impression Statement  Pt demo'd less abdominal separation and was advanced to an exercise that helped to increase scapular stability/shoulder ER strengthening while also strengtehning obliques to minimize abdominal separation below sternum. Pt demo'd good carry over with diaphragmatic excursion without abdominal straining which is an improvement from last session. Manual Tx helped to increase R DF AROM. Pt showed weakness with R plantarflexion strength which will help with his goal to get up from the floor in half kneeling position because PF and toe ext will be needed in the back leg. Pt was explained about how these lower kinetich chain improvements are necessary to help with tennis playing with less risk of injuries. Pt continues to benefit from skilled PT.      Rehab Potential  Good    PT Frequency  1x / week    PT Duration  12 weeks    PT Treatment/Interventions  Stair training;Therapeutic exercise;Therapeutic activities;Balance training;Neuromuscular re-education;Moist Heat;Aquatic Therapy;Patient/family education;Scar mobilization;Manual techniques;Energy conservation;Taping;Manual lymph drainage;Functional mobility training    Consulted and Agree with Plan of Care  Patient       Patient will benefit from skilled therapeutic intervention in order to improve the following deficits and impairments:  Increased muscle spasms, Hypomobility, Improper body mechanics, Impaired sensation, Increased fascial restricitons, Postural dysfunction, Pain, Decreased strength, Decreased safety awareness, Decreased coordination, Decreased range of motion, Decreased endurance, Decreased balance, Abnormal  gait, Decreased mobility  Visit Diagnosis: Other lack of coordination  Other abnormalities of gait and mobility     Problem List Patient Active Problem List   Diagnosis Date Noted  . Chest pain 10/22/2017  . Anxiety 10/22/2017  . Arm numbness 10/22/2017  . Fatigue 10/22/2017  . Muscle weakness 09/17/2017  . Umbilical hernia without obstruction and without gangrene 04/15/2017  . Irritable bowel syndrome with constipation 01/04/2015  . HYPOTHYROIDISM 07/06/2010  . DYSLIPIDEMIA 07/06/2010  . GERD 07/06/2010  . Pruritic disorder 07/22/2007  . Benign prostatic hyperplasia without urinary obstruction 10/24/2006    Jerl Mina ,PT, DPT, E-RYT  08/31/2018, 6:16 PM  [place] MAIN [facility] SERVICES [address] [place], [place], [address] Phone: [phone]   Fax:  [phone]  Name: Arul Farabee MRN: [id number] Date of Birth: 1961-04-02

## 2018-09-08 ENCOUNTER — Encounter: Payer: Managed Care, Other (non HMO) | Admitting: Physical Therapy

## 2018-09-24 ENCOUNTER — Encounter: Payer: Self-pay | Admitting: Physical Therapy

## 2018-09-24 DIAGNOSIS — R2689 Other abnormalities of gait and mobility: Secondary | ICD-10-CM

## 2018-09-24 DIAGNOSIS — R278 Other lack of coordination: Secondary | ICD-10-CM

## 2018-09-24 NOTE — Therapy (Addendum)
[place] MAIN [facility] SERVICES [address] [place], [place], [address] Phone: [phone]   Fax:  [phone]  Patient Details  Name: Christopher Shannon MRN: [id number] Date of Birth: 08/14/1961 Referring Provider:  [name]  Encounter Date: 09/24/2018  Discharge Summary   Between 08/04/18 through 08/31/18, pt completed 5 visits.   Pt has achieved 3/6 goals. DPT was not able to speak with pt to reassess his remaining 3 goals before pt self-d/c.  Based on prior visits, pt demonstrated significantly improved abdominal separation from ( 4 fingers width to  2 fingers below sternum, 1 fingers above umbilicus), improved posture with proper diaphragmatic , transverse abdominal mm, and pelvic floor co-activation. Pt demo'd proper coordination with these muscles with less straining of the abdominopelvic area in various functional movements. Pt was educated and demonstrated IND with proper body mechanics in activities including floor to stand transfers with half-kneeling to standing to minimize Sx.  These musculoskeletal improvements yield a more effective intraabdominal pressure system which will continue to help minimize his Sx.Marland Kitchen Pt reported at his last session that he has noticed groin pain has lessened.   Planned for future sessions to focus on helping pt return to tennis playing with proper strengthening and complimentary flexibility routine to minimize relapse of Sx but pt has self-d/c. Pt has called to self-d/c after completing 5 visits with report that he is doing better.  Thank you for your referral!     PT Long Term Goals - 08/06/18 0946      PT LONG TERM GOAL #1   Title  Pt will increase his PSFS score for getting out of car and running 4/10 to > 9/10, standing from kneeling 2/10 to > 8/10 in order to return to functional activities     Time  12    Period  Weeks    Status  Unable to Assess   Target Date  10/29/18      PT LONG TERM GOAL #2   Title  Pt will decrease NIH-CPSI from 30% to < 15% in order to improve pelvic floor function and decrease pain     Time  10    Period  Weeks    Status  Unable to Assess   Target Date  10/15/18      PT LONG TERM GOAL #3   Title  Pt will demo no delay /difficulty with half kneeling ( L leg in front) with proper alignment transferring to stand     Time  4    Period  Weeks    Status Achieved    Target Date  09/03/18      PT LONG TERM GOAL #4   Title  Pt will demo abdominal separation from 4 fingers to < 2 finger in order to improve intraabdominal pressure to decrease worsening of umbilical hernia     Time  8    Period  Weeks    Status  Achieved   Target Date  08/06/18      PT LONG TERM GOAL #5   Title  Pt will demo improved hip flexibility with crossing ankle over opposite thigh to don/doff shoes without spinal flexion to optimize hip mobility for tennis playing and minimize risk for injuries at abdomen and hips     Time  6    Period  Weeks    Status Achieved    Target Date  09/17/18         Jerl Mina   ,  PT, DPT, E-RYT  09/24/2018, 1:29 PM  [place] MAIN [facility] SERVICES [address] [place], [place], [address] Phone: [phone]   Fax:  [phone]

## 2018-09-29 ENCOUNTER — Encounter: Payer: Managed Care, Other (non HMO) | Admitting: Physical Therapy

## 2018-10-06 ENCOUNTER — Encounter: Payer: Managed Care, Other (non HMO) | Admitting: Physical Therapy

## 2018-10-20 ENCOUNTER — Encounter: Payer: Managed Care, Other (non HMO) | Admitting: Physical Therapy

## 2018-11-03 ENCOUNTER — Encounter: Payer: Managed Care, Other (non HMO) | Admitting: Physical Therapy

## 2018-11-17 ENCOUNTER — Encounter: Payer: Managed Care, Other (non HMO) | Admitting: Physical Therapy

## 2019-06-29 NOTE — Progress Notes (Deleted)
Patient: Christopher Shannon, Male    DOB: 23-Feb-1961, 58 y.o.   MRN: [id number] Visit Date: 06/29/2019  Today's Provider: Wilhemena Durie, MD   No chief complaint on file.  Subjective:     Annual physical exam Christopher Shannon is a 58 y.o. male who presents today for health maintenance and complete physical. He feels {DESC; WELL/FAIRLY WELL/POORLY:18703}. He reports exercising ***. He reports he is sleeping {DESC; WELL/FAIRLY WELL/POORLY:18703}.  -----------------------------------------------------------------   Review of Systems  Social History      He  reports that he has never smoked. He has never used smokeless tobacco. He reports that he does not drink alcohol or use drugs.       Social History   Socioeconomic History  . Marital status: Married    Spouse name: Not on file  . Number of children: Not on file  . Years of education: Not on file  . Highest education level: Not on file  Occupational History  . Not on file  Social Needs  . Financial resource strain: Not on file  . Food insecurity    Worry: Not on file    Inability: Not on file  . Transportation needs    Medical: Not on file    Non-medical: Not on file  Tobacco Use  . Smoking status: Never Smoker  . Smokeless tobacco: Never Used  Substance and Sexual Activity  . Alcohol use: No  . Drug use: No  . Sexual activity: Not on file  Lifestyle  . Physical activity    Days per week: Not on file    Minutes per session: Not on file  . Stress: Not on file  Relationships  . Social Herbalist on phone: Not on file    Gets together: Not on file    Attends religious service: Not on file    Active member of club or organization: Not on file    Attends meetings of clubs or organizations: Not on file    Relationship status: Not on file  Other Topics Concern  . Not on file  Social History Narrative  . Not on file    Past Medical History:  Diagnosis Date  . Anxiety 10/22/2017   . Arm numbness 10/22/2017  . Arthritis   . Benign prostatic hyperplasia without urinary obstruction 10/24/2006  . Fatigue 10/22/2017  . GERD 07/06/2010   Qualifier: Diagnosis of  By: Loanne Drilling MD, Jacelyn Pi   . GERD (gastroesophageal reflux disease)    NO MEDS  . Hiatal hernia   . Irritable bowel syndrome with constipation 01/04/2015   Worse after intentional wt loss.   . Joint pain   . Umbilical hernia without obstruction and without gangrene 04/15/2017     Patient Active Problem List   Diagnosis Date Noted  . Chest pain 10/22/2017  . Anxiety 10/22/2017  . Arm numbness 10/22/2017  . Fatigue 10/22/2017  . Muscle weakness 09/17/2017  . Umbilical hernia without obstruction and without gangrene 04/15/2017  . Irritable bowel syndrome with constipation 01/04/2015  . HYPOTHYROIDISM 07/06/2010  . DYSLIPIDEMIA 07/06/2010  . GERD 07/06/2010  . Pruritic disorder 07/22/2007  . Benign prostatic hyperplasia without urinary obstruction 10/24/2006    Past Surgical History:  Procedure Laterality Date  . COLONOSCOPY  2011  . TONSILLECTOMY     AGE 21  . UMBILICAL HERNIA REPAIR N/A 05/20/2018   Procedure: HERNIA REPAIR UMBILICAL ADULT;  Surgeon: Florene Glen, MD;  Location: [facility]  ORS;  Service: General;  Laterality: N/A;  . UPPER GI ENDOSCOPY      Family History        Family Status  Relation Name Status  . Mother  Deceased  . Father  Deceased  . Sister  Alive  . Brother  Alive  . Sister  Alive  . Daughter  Alive  . Son  Alive  . Daughter  Alive        His family history includes Asthma in his son; Heart disease in his father; Lupus in his mother; Stomach cancer in his mother; Thyroid disease in his mother.      No Known Allergies  No current outpatient medications on file.   Patient Care Team: Jerrol Banana., MD as PCP - General (Family Medicine) Jerrol Banana., MD (Family Medicine) Bary Castilla Forest Gleason, MD (General Surgery)    Objective:    Vitals: There  were no vitals taken for this visit.  There were no vitals filed for this visit.   Physical Exam   Depression Screen PHQ 2/9 Scores 07/02/2018 04/02/2017 04/01/2016  PHQ - 2 Score 0 0 0  PHQ- 9 Score - 2 -       Assessment & Plan:     Routine Health Maintenance and Physical Exam  Exercise Activities and Dietary recommendations Goals   None     Immunization History  Administered Date(s) Administered  . Td 11/12/2003, 12/03/2011    Health Maintenance  Topic Date Due  . INFLUENZA VACCINE  04/03/2019  . HIV Screening  07/03/2019 (Originally 11/16/1974)  . COLONOSCOPY  06/11/2020  . TETANUS/TDAP  12/02/2021  . Hepatitis C Screening  Completed     Discussed health benefits of physical activity, and encouraged him to engage in regular exercise appropriate for his age and condition.    --------------------------------------------------------------------    Wilhemena Durie, MD  [place]

## 2019-07-01 ENCOUNTER — Encounter: Payer: Self-pay | Admitting: Family Medicine

## 2019-07-01 ENCOUNTER — Encounter: Payer: Managed Care, Other (non HMO) | Admitting: Family Medicine

## 2019-07-01 ENCOUNTER — Other Ambulatory Visit: Payer: Self-pay

## 2019-07-07 NOTE — Progress Notes (Signed)
Patient: Christopher Shannon, Male    DOB: Sep 30, 1960, 58 y.o.   MRN: [id number] Visit Date: 07/08/2019  Today's Provider: Wilhemena Durie, MD   Chief Complaint  Patient presents with  . Annual Exam   Subjective:     Annual physical exam Christopher Shannon is a 58 y.o. male who presents today for health maintenance and complete physical. He feels well. He reports exercising occasionally. He reports he is sleeping well.   Colonoscopy- 06/11/2010. Normal.  Repeat in 10 years.   Immunization History  Administered Date(s) Administered  . Td 11/12/2003, 12/03/2011    Review of Systems  Constitutional: Negative.   HENT: Negative.   Eyes: Negative.   Respiratory: Negative.   Cardiovascular: Negative.   Gastrointestinal: Negative.   Allergic/Immunologic: Negative.   Neurological:       Right RLS.  Hematological: Negative.   Psychiatric/Behavioral: Negative.     Social History      He  reports that he has never smoked. He has never used smokeless tobacco. He reports that he does not drink alcohol or use drugs.       Social History   Socioeconomic History  . Marital status: Married    Spouse name: Not on file  . Number of children: Not on file  . Years of education: Not on file  . Highest education level: Not on file  Occupational History  . Not on file  Social Needs  . Financial resource strain: Not on file  . Food insecurity    Worry: Not on file    Inability: Not on file  . Transportation needs    Medical: Not on file    Non-medical: Not on file  Tobacco Use  . Smoking status: Never Smoker  . Smokeless tobacco: Never Used  Substance and Sexual Activity  . Alcohol use: No  . Drug use: No  . Sexual activity: Not on file  Lifestyle  . Physical activity    Days per week: Not on file    Minutes per session: Not on file  . Stress: Not on file  Relationships  . Social Herbalist on phone: Not on file    Gets together: Not on file    Attends religious service: Not on file    Active member of club or organization: Not on file    Attends meetings of clubs or organizations: Not on file    Relationship status: Not on file  Other Topics Concern  . Not on file  Social History Narrative  . Not on file    Past Medical History:  Diagnosis Date  . Anxiety 10/22/2017  . Arm numbness 10/22/2017  . Arthritis   . Benign prostatic hyperplasia without urinary obstruction 10/24/2006  . Fatigue 10/22/2017  . GERD 07/06/2010   Qualifier: Diagnosis of  By: Loanne Drilling MD, Jacelyn Pi   . GERD (gastroesophageal reflux disease)    NO MEDS  . Hiatal hernia   . Irritable bowel syndrome with constipation 01/04/2015   Worse after intentional wt loss.   . Joint pain   . Umbilical hernia without obstruction and without gangrene 04/15/2017     Patient Active Problem List   Diagnosis Date Noted  . Chest pain 10/22/2017  . Anxiety 10/22/2017  . Arm numbness 10/22/2017  . Fatigue 10/22/2017  . Muscle weakness 09/17/2017  . Umbilical hernia without obstruction and without gangrene 04/15/2017  . Irritable bowel syndrome with constipation 01/04/2015  .  HYPOTHYROIDISM 07/06/2010  . DYSLIPIDEMIA 07/06/2010  . GERD 07/06/2010  . Pruritic disorder 07/22/2007  . Benign prostatic hyperplasia without urinary obstruction 10/24/2006    Past Surgical History:  Procedure Laterality Date  . COLONOSCOPY  2011  . TONSILLECTOMY     AGE 8  . UMBILICAL HERNIA REPAIR N/A 05/20/2018   Procedure: HERNIA REPAIR UMBILICAL ADULT;  Surgeon: Florene Glen, MD;  Location: [facility] ORS;  Service: General;  Laterality: N/A;  . UPPER GI ENDOSCOPY      Family History        Family Status  Relation Name Status  . Mother  Deceased  . Father  Deceased  . Sister  Alive  . Brother  Alive  . Sister  Alive  . Daughter  Alive  . Son  Alive  . Daughter  Alive        His family history includes Asthma in his son; Heart disease in his father; Lupus in his mother;  Stomach cancer in his mother; Thyroid disease in his mother.      No Known Allergies  No current outpatient medications on file.   Patient Care Team: Jerrol Banana., MD as PCP - General (Family Medicine) Jerrol Banana., MD (Family Medicine) Bary Castilla Forest Gleason, MD (General Surgery)    Objective:    Vitals: BP 118/76   Pulse 72   Temp 97.9 F (36.6 C)   Resp 16   Ht 5\' 9"  (1.753 m)   Wt 196 lb (88.9 kg)   SpO2 100%   BMI 28.94 kg/m    Vitals:   07/08/19 0914  BP: 118/76  Pulse: 72  Resp: 16  Temp: 97.9 F (36.6 C)  SpO2: 100%  Weight: 196 lb (88.9 kg)  Height: 5\' 9"  (1.753 m)     Physical Exam Vitals signs reviewed.  Constitutional:      Appearance: He is well-developed.  HENT:     Head: Normocephalic and atraumatic.     Right Ear: External ear normal.     Left Ear: External ear normal.     Nose: Nose normal.  Eyes:     Conjunctiva/sclera: Conjunctivae normal.     Pupils: Pupils are equal, round, and reactive to light.  Neck:     Musculoskeletal: Normal range of motion and neck supple.  Cardiovascular:     Rate and Rhythm: Normal rate and regular rhythm.     Heart sounds: Normal heart sounds.  Pulmonary:     Effort: Pulmonary effort is normal.     Breath sounds: Normal breath sounds.  Abdominal:     General: Bowel sounds are normal.     Palpations: Abdomen is soft.  Genitourinary:    Penis: Normal.      Prostate: Normal.     Rectum: Normal. Guaiac result negative.     Comments: Possible right hydrocele.  Certainly no hernia noted. Musculoskeletal: Normal range of motion.  Skin:    General: Skin is warm and dry.  Neurological:     Mental Status: He is alert and oriented to person, place, and time.  Psychiatric:        Behavior: Behavior normal.        Thought Content: Thought content normal.        Judgment: Judgment normal.      Depression Screen PHQ 2/9 Scores 07/08/2019 07/02/2018 04/02/2017 04/01/2016  PHQ - 2 Score 0 0 0 0   PHQ- 9 Score - - 2 -  Assessment & Plan:     Routine Health Maintenance and Physical Exam  Exercise Activities and Dietary recommendations Goals   None     Immunization History  Administered Date(s) Administered  . Td 11/12/2003, 12/03/2011    Health Maintenance  Topic Date Due  . HIV Screening  11/16/1974  . INFLUENZA VACCINE  12/01/2019 (Originally 04/03/2019)  . COLONOSCOPY  06/11/2020  . TETANUS/TDAP  12/02/2021  . Hepatitis C Screening  Completed     Discussed health benefits of physical activity, and encouraged him to engage in regular exercise appropriate for his age and condition.   1. Annual physical exam  - CBC with Differential - Comprehensive Metabolic Panel (CMET) - Lipid Profile - TSH - POCT Urinalysis Dipstick  2. Benign prostatic hyperplasia without urinary obstruction Right testicular enlargement/hydrocele.  Certainly no mass.  Have offered urology referral for evaluation but he declines.  He thinks this is from his injury a couple years ago when playing tennis. - PSA 3.Possible RLS Discussed possible treatment of this.  Would consider gabapentin or ropinirole.  I do not think it is worth treating at this point in time.  --------------------------------------------------------------------    Wilhemena Durie, MD  [facility]

## 2019-07-08 ENCOUNTER — Encounter: Payer: Self-pay | Admitting: Family Medicine

## 2019-07-08 ENCOUNTER — Ambulatory Visit (INDEPENDENT_AMBULATORY_CARE_PROVIDER_SITE_OTHER): Payer: Managed Care, Other (non HMO) | Admitting: Family Medicine

## 2019-07-08 ENCOUNTER — Other Ambulatory Visit: Payer: Self-pay

## 2019-07-08 VITALS — BP 118/76 | HR 72 | Temp 97.9°F | Resp 16 | Ht 69.0 in | Wt 196.0 lb

## 2019-07-08 DIAGNOSIS — Z0000 Encounter for general adult medical examination without abnormal findings: Secondary | ICD-10-CM | POA: Diagnosis not present

## 2019-07-08 DIAGNOSIS — N400 Enlarged prostate without lower urinary tract symptoms: Secondary | ICD-10-CM

## 2019-07-08 DIAGNOSIS — G2581 Restless legs syndrome: Secondary | ICD-10-CM | POA: Diagnosis not present

## 2019-07-08 LAB — POCT URINALYSIS DIPSTICK
Bilirubin, UA: NEGATIVE
Blood, UA: NEGATIVE
Glucose, UA: NEGATIVE
Ketones, UA: NEGATIVE
Leukocytes, UA: NEGATIVE
Nitrite, UA: NEGATIVE
Protein, UA: NEGATIVE
Spec Grav, UA: 1.025 (ref 1.010–1.025)
Urobilinogen, UA: 0.2 E.U./dL
pH, UA: 7 (ref 5.0–8.0)

## 2019-07-09 LAB — CBC WITH DIFFERENTIAL/PLATELET
Basophils Absolute: 0 10*3/uL (ref 0.0–0.2)
Basos: 1 %
EOS (ABSOLUTE): 0.2 10*3/uL (ref 0.0–0.4)
Eos: 3 %
Hematocrit: 47.4 % (ref 37.5–51.0)
Hemoglobin: 15.8 g/dL (ref 13.0–17.7)
Immature Grans (Abs): 0 10*3/uL (ref 0.0–0.1)
Immature Granulocytes: 0 %
Lymphocytes Absolute: 2 10*3/uL (ref 0.7–3.1)
Lymphs: 33 %
MCH: 28.2 pg (ref 26.6–33.0)
MCHC: 33.3 g/dL (ref 31.5–35.7)
MCV: 85 fL (ref 79–97)
Monocytes Absolute: 0.5 10*3/uL (ref 0.1–0.9)
Monocytes: 9 %
Neutrophils Absolute: 3.3 10*3/uL (ref 1.4–7.0)
Neutrophils: 54 %
Platelets: 211 10*3/uL (ref 150–450)
RBC: 5.6 x10E6/uL (ref 4.14–5.80)
RDW: 13.6 % (ref 11.6–15.4)
WBC: 6 10*3/uL (ref 3.4–10.8)

## 2019-07-09 LAB — TSH: TSH: 4.63 u[IU]/mL — ABNORMAL HIGH (ref 0.450–4.500)

## 2019-07-09 LAB — COMPREHENSIVE METABOLIC PANEL
ALT: 22 IU/L (ref 0–44)
AST: 16 IU/L (ref 0–40)
Albumin/Globulin Ratio: 2.4 — ABNORMAL HIGH (ref 1.2–2.2)
Albumin: 4.8 g/dL (ref 3.8–4.9)
Alkaline Phosphatase: 60 IU/L (ref 39–117)
BUN/Creatinine Ratio: 11 (ref 9–20)
BUN: 12 mg/dL (ref 6–24)
Bilirubin Total: 0.8 mg/dL (ref 0.0–1.2)
CO2: 25 mmol/L (ref 20–29)
Calcium: 9.5 mg/dL (ref 8.7–10.2)
Chloride: 100 mmol/L (ref 96–106)
Creatinine, Ser: 1.14 mg/dL (ref 0.76–1.27)
GFR calc Af Amer: 81 mL/min/{1.73_m2} (ref >59)
GFR calc non Af Amer: 70 mL/min/{1.73_m2} (ref >59)
Globulin, Total: 2 g/dL (ref 1.5–4.5)
Glucose: 96 mg/dL (ref 65–99)
Potassium: 4.3 mmol/L (ref 3.5–5.2)
Sodium: 139 mmol/L (ref 134–144)
Total Protein: 6.8 g/dL (ref 6.0–8.5)

## 2019-07-09 LAB — LIPID PANEL
Chol/HDL Ratio: 4.6 ratio (ref 0.0–5.0)
Cholesterol, Total: 224 mg/dL — ABNORMAL HIGH (ref 100–199)
HDL: 49 mg/dL (ref >39)
LDL Chol Calc (NIH): 148 mg/dL — ABNORMAL HIGH (ref 0–99)
Triglycerides: 151 mg/dL — ABNORMAL HIGH (ref 0–149)
VLDL Cholesterol Cal: 27 mg/dL (ref 5–40)

## 2019-07-09 LAB — PSA: Prostate Specific Ag, Serum: 1.2 ng/mL (ref 0.0–4.0)

## 2019-08-18 ENCOUNTER — Emergency Department
Admission: EM | Admit: 2019-08-18 | Discharge: 2019-08-18 | Disposition: A | Discharge status: Home / Self Care | Payer: Managed Care, Other (non HMO) | Attending: Emergency Medicine | Admitting: Emergency Medicine

## 2019-08-18 ENCOUNTER — Emergency Department: Payer: Managed Care, Other (non HMO)

## 2019-08-18 DIAGNOSIS — E039 Hypothyroidism, unspecified: Secondary | ICD-10-CM | POA: Diagnosis not present

## 2019-08-18 DIAGNOSIS — R0789 Other chest pain: Secondary | ICD-10-CM | POA: Insufficient documentation

## 2019-08-18 DIAGNOSIS — R079 Chest pain, unspecified: Secondary | ICD-10-CM

## 2019-08-18 LAB — TROPONIN I (HIGH SENSITIVITY): Troponin I (High Sensitivity): 3 ng/L (ref <18)

## 2019-08-18 LAB — BASIC METABOLIC PANEL
Anion gap: 9 (ref 5–15)
BUN: 16 mg/dL (ref 6–20)
CO2: 25 mmol/L (ref 22–32)
Calcium: 9.2 mg/dL (ref 8.9–10.3)
Chloride: 106 mmol/L (ref 98–111)
Creatinine, Ser: 1.15 mg/dL (ref 0.61–1.24)
GFR calc Af Amer: >60 mL/min (ref >60)
GFR calc non Af Amer: >60 mL/min (ref >60)
Glucose, Bld: 102 mg/dL — ABNORMAL HIGH (ref 70–99)
Potassium: 4.1 mmol/L (ref 3.5–5.1)
Sodium: 140 mmol/L (ref 135–145)

## 2019-08-18 LAB — CBC
HCT: 46.7 % (ref 39.0–52.0)
Hemoglobin: 15.3 g/dL (ref 13.0–17.0)
MCH: 28.1 pg (ref 26.0–34.0)
MCHC: 32.8 g/dL (ref 30.0–36.0)
MCV: 85.8 fL (ref 80.0–100.0)
Platelets: 251 10*3/uL (ref 150–400)
RBC: 5.44 MIL/uL (ref 4.22–5.81)
RDW: 13.7 % (ref 11.5–15.5)
WBC: 7.9 10*3/uL (ref 4.0–10.5)
nRBC: 0 % (ref 0.0–0.2)

## 2019-08-18 NOTE — Discharge Instructions (Signed)
Your tests today including your EKG, chest x-ray, and blood tests were all okay.  Although your symptoms are unlikely to be due to a heart condition and your work-up today is reassuring, you should follow-up with your primary care doctor for continued monitoring of your symptoms and consideration of any further screening tests that need to be done.

## 2019-08-18 NOTE — ED Provider Notes (Signed)
[facility] Emergency Department Provider Note  ____________________________________________  Time seen: Approximately 9:37 PM  I have reviewed the triage vital signs and the nursing notes.   HISTORY  Chief Complaint Chest Pain    HPI Christopher Shannon is a 58 y.o. male with a history of GERD, hiatal hernia, hyperlipidemia, anxiety who comes the ED complaining of spasming chest pain in the left side of the chest that started about 4:00 PM today.  It is fleeting, lasting less than 1 second at a time, occurring frequently.  Nonradiating, no shortness of breath diaphoresis vomiting palpitations dizziness or syncope.  Not exertional, not pleuritic.  He does note that he was doing strenuous physical work earlier today and while he had no symptoms during that time, symptoms did start within a few hours afterward.   Denies hypertension or diabetes.   Past Medical History:  Diagnosis Date  . Anxiety 10/22/2017  . Arm numbness 10/22/2017  . Arthritis   . Benign prostatic hyperplasia without urinary obstruction 10/24/2006  . Fatigue 10/22/2017  . GERD 07/06/2010   Qualifier: Diagnosis of  By: Loanne Drilling MD, Jacelyn Pi   . GERD (gastroesophageal reflux disease)    NO MEDS  . Hiatal hernia   . Irritable bowel syndrome with constipation 01/04/2015   Worse after intentional wt loss.   . Joint pain   . Umbilical hernia without obstruction and without gangrene 04/15/2017     Patient Active Problem List   Diagnosis Date Noted  . Chest pain 10/22/2017  . Anxiety 10/22/2017  . Arm numbness 10/22/2017  . Fatigue 10/22/2017  . Muscle weakness 09/17/2017  . Umbilical hernia without obstruction and without gangrene 04/15/2017  . Irritable bowel syndrome with constipation 01/04/2015  . HYPOTHYROIDISM 07/06/2010  . DYSLIPIDEMIA 07/06/2010  . GERD 07/06/2010  . Pruritic disorder 07/22/2007  . Benign prostatic hyperplasia without urinary obstruction 10/24/2006     Past  Surgical History:  Procedure Laterality Date  . COLONOSCOPY  2011  . TONSILLECTOMY     AGE 85  . UMBILICAL HERNIA REPAIR N/A 05/20/2018   Procedure: HERNIA REPAIR UMBILICAL ADULT;  Surgeon: Florene Glen, MD;  Location: [facility] ORS;  Service: General;  Laterality: N/A;  . UPPER GI ENDOSCOPY       Prior to Admission medications   Not on File     Allergies Patient has no known allergies.   Family History  Problem Relation Age of Onset  . Lupus Mother   . Stomach cancer Mother   . Thyroid disease Mother   . Heart disease Father   . Asthma Son   Negative for early CAD  Social History Social History   Tobacco Use  . Smoking status: Never Smoker  . Smokeless tobacco: Never Used  Substance Use Topics  . Alcohol use: No  . Drug use: No    Review of Systems  Constitutional:   No fever or chills.  ENT:   No sore throat. No rhinorrhea. Cardiovascular:   Positive chest pain as above without syncope. Respiratory:   No dyspnea or cough. Gastrointestinal:   Negative for abdominal pain, vomiting and diarrhea.  Musculoskeletal:   Negative for focal pain or swelling All other systems reviewed and are negative except as documented above in ROS and HPI.  ____________________________________________   PHYSICAL EXAM:  VITAL SIGNS: ED Triage Vitals [08/18/19 2043]  Enc Vitals Group     BP (!) 159/90     Pulse Rate 74     Resp 18  Temp 98.9 F (37.2 C)     Temp Source Oral     SpO2 100 %     Weight      Height      Head Circumference      Peak Flow      Pain Score      Pain Loc      Pain Edu?      Excl. in [place]?     Vital signs reviewed, nursing assessments reviewed.   Constitutional:   Alert and oriented. Non-toxic appearance. Eyes:   Conjunctivae are normal. EOMI. PERRL. ENT      Head:   Normocephalic and atraumatic.      Nose:   Wearing a mask.      Mouth/Throat:   Wearing a mask.      Neck:   No meningismus. Full  ROM. Hematological/Lymphatic/Immunilogical:   No cervical lymphadenopathy. Cardiovascular:   RRR. Symmetric bilateral radial and DP pulses.  No murmurs. Cap refill less than 2 seconds. Respiratory:   Normal respiratory effort without tachypnea/retractions. Breath sounds are clear and equal bilaterally. No wheezes/rales/rhonchi. Gastrointestinal:   Soft and nontender. Non distended. There is no CVA tenderness.  No rebound, rigidity, or guarding.  Musculoskeletal:   Normal range of motion in all extremities. No joint effusions.  No lower extremity tenderness.  No edema.  Chest wall nontender Neurologic:   Normal speech and language.  Motor grossly intact. No acute focal neurologic deficits are appreciated.  Skin:    Skin is warm, dry and intact. No rash noted.  No petechiae, purpura, or bullae.  ____________________________________________    LABS (pertinent positives/negatives) (all labs ordered are listed, but only abnormal results are displayed) Labs Reviewed  BASIC METABOLIC PANEL - Abnormal; Notable for the following components:      Result Value   Glucose, Bld 102 (*)    All other components within normal limits  CBC  TROPONIN I (HIGH SENSITIVITY)   ____________________________________________   EKG  Interpreted by me  Date: 08/18/2019  Rate: 72  Rhythm: normal sinus rhythm  QRS Axis: normal  Intervals: normal  ST/T Wave abnormalities: normal  Conduction Disutrbances: none  Narrative Interpretation: unremarkable      ____________________________________________    RADIOLOGY  DG Chest 2 View  Result Date: 08/18/2019 CLINICAL DATA:  Chest pain EXAM: CHEST - 2 VIEW COMPARISON:  12/11/2005 FINDINGS: The heart size and mediastinal contours are within normal limits. Both lungs are clear. The visualized skeletal structures are unremarkable. IMPRESSION: No active cardiopulmonary disease. Electronically Signed   By: Davina Poke M.D.   On: 08/18/2019 21:18     ____________________________________________   PROCEDURES Procedures  ____________________________________________  DIFFERENTIAL DIAGNOSIS   GERD, chest wall strain, pleurisy  CLINICAL IMPRESSION / ASSESSMENT AND PLAN / ED COURSE  Medications ordered in the ED: Medications - No data to display  Pertinent labs & imaging results that were available during my care of the patient were reviewed by me and considered in my medical decision making (see chart for details).  Christopher Shannon was evaluated in Emergency Department on 08/18/2019 for the symptoms described in the history of present illness. He was evaluated in the context of the global COVID-19 pandemic, which necessitated consideration that the patient might be at risk for infection with the SARS-CoV-2 virus that causes COVID-19. Institutional protocols and algorithms that pertain to the evaluation of patients at risk for COVID-19 are in a state of rapid change based on information released by regulatory bodies  including the CDC and federal and state organizations. These policies and algorithms were followed during the patient's care in the ED.   Patient presents with atypical chest pain.  Vital signs are normal, exam is reassuring.  EKG chest x-ray and troponin are all normal.  Heart score is low risk.  Considering the patient's symptoms, medical history, and physical examination today, I have low suspicion for ACS, PE, TAD, pneumothorax, carditis, mediastinitis, pneumonia, CHF, or sepsis. Recommend trial of heat therapy as well as Tums and Pepcid while planning for follow-up with his primary care doctor for further evaluation of the symptoms.        ____________________________________________   FINAL CLINICAL IMPRESSION(S) / ED DIAGNOSES    Final diagnoses:  Nonspecific chest pain     ED Discharge Orders    None      Portions of this note were generated with dragon dictation software. Dictation errors  may occur despite best attempts at proofreading.   Carrie Mew, MD 08/18/19 2140

## 2019-08-18 NOTE — ED Triage Notes (Signed)
Pt reports "spasam" feeling on the left side of chest that radiated into the left shoulder. Pt started to have lightheadedness tonight and took 324 mg Asprin approx. 1 hour ago. Pt denies SOB, N/V.

## 2019-12-31 NOTE — Progress Notes (Signed)
Established patient visit  I,[name],acting as a scribe for Christopher Durie, MD.,have documented all relevant documentation on the behalf of Christopher Durie, MD,as directed by  Christopher Durie, MD while in the presence of Christopher Durie, MD.   Patient: Christopher Shannon   DOB: 12-14-1960   59 y.o. Male  MRN: [id number] Visit Date: 01/05/2020  Today's healthcare provider: Wilhemena Durie, MD   Chief Complaint  Patient presents with  . Follow-up  . Leg Pain   Subjective    HPI Patient has had bilateral shoulder pain significant enough with abduction that he is unable to play tennis anymore.  He can hit the ball but he cannot serve. He has 2 other pain complaints.  1 is nocturnal pain and a spot in his left outer thigh that only bothers him at night in bed.  There is no other time it occurs.  It is not every night. The last complaint is 1 of chronic left testicular discomfort with swelling.  He states this started when he got hit with a tennis ball several years ago. Patient has not had Covid vaccine and have advised him to pursue this. Possible RLS From 07/08/2019-Discussed possible treatment of this.  Would consider gabapentin or ropinirole.  I do not think it is worth treating at this point in time.  Patient also wanted to discuss an ER visit he had 08/18/2019. Patient states he has had the same pain a couple of times since the ER visit but milder. Patient states the pain occurred with activity.       Medications: No outpatient medications prior to visit.   No facility-administered medications prior to visit.    Review of Systems  Constitutional: Negative for appetite change, chills and fever.  Eyes: Negative.   Respiratory: Negative for chest tightness, shortness of breath and wheezing.   Cardiovascular: Negative for chest pain and palpitations.  Gastrointestinal: Negative for abdominal pain, nausea and vomiting.  Endocrine: Negative.     Genitourinary: Positive for scrotal swelling and testicular pain.  Musculoskeletal: Positive for arthralgias.  Allergic/Immunologic: Negative.   Hematological: Negative.   Psychiatric/Behavioral: Negative.        Objective    BP 125/75 (BP Location: Right Arm, Patient Position: Sitting, Cuff Size: Large)   Pulse 77   Temp (!) 96.9 F (36.1 C) (Other (Comment))   Resp 16   Ht 5\' 9"  (1.753 m)   Wt 203 lb (92.1 kg)   SpO2 97%   BMI 29.98 kg/m  Wt Readings from Last 3 Encounters:  01/06/20 190 lb (86.2 kg)  01/05/20 203 lb (92.1 kg)  07/08/19 196 lb (88.9 kg)      Physical Exam Vitals reviewed.  Constitutional:      Appearance: He is well-developed.  HENT:     Head: Normocephalic and atraumatic.     Right Ear: External ear normal.     Left Ear: External ear normal.     Nose: Nose normal.  Eyes:     Conjunctiva/sclera: Conjunctivae normal.     Pupils: Pupils are equal, round, and reactive to light.  Cardiovascular:     Rate and Rhythm: Normal rate and regular rhythm.     Heart sounds: Normal heart sounds.  Pulmonary:     Effort: Pulmonary effort is normal.     Breath sounds: Normal breath sounds.  Abdominal:     General: Bowel sounds are normal.     Palpations: Abdomen is soft.  Genitourinary:    Penis: Normal.      Rectum: Guaiac result negative.     Comments: Moderate size left hydrocele.  Did not appreciate a mass or hernia. Musculoskeletal:        General: Normal range of motion.     Cervical back: Normal range of motion and neck supple.     Comments: He has pain with abduction of both shoulders. Examination of left lower leg and thigh is normal.  Skin:    General: Skin is warm and dry.  Neurological:     Mental Status: He is alert and oriented to person, place, and time.  Psychiatric:        Behavior: Behavior normal.        Thought Content: Thought content normal.        Judgment: Judgment normal.       No results found for any visits on  01/05/20.  Assessment & Plan     1. Bilateral shoulder pain, unspecified chronicity Shoulder impingement/arthropathy.  Try naproxen for the rest of this month but go ahead and refer to orthopedics.  He would like to get back to playing tennis and I think this would help him. - Ambulatory referral to Orthopedic Surgery - naproxen (NAPROSYN) 500 MG tablet; Take 1 tablet (500 mg total) by mouth 2 (two) times daily with a meal.  Dispense: 60 tablet; Refill: 3  2. 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  He is having some discomfort with this. - Ambulatory referral to Urology  3. Hydrocele in adult    No follow-ups on file.      I, Christopher Durie, MD, have reviewed all documentation for this visit. The documentation on 01/07/20 for the exam, diagnosis, procedures, and orders are all accurate and complete.    Jilliam Bellmore Cranford Mon, MD  [facility] [phone] (phone) [phone] (fax)  [place]

## 2020-01-05 ENCOUNTER — Encounter: Payer: Self-pay | Admitting: Family Medicine

## 2020-01-05 ENCOUNTER — Other Ambulatory Visit: Payer: Self-pay

## 2020-01-05 ENCOUNTER — Ambulatory Visit (INDEPENDENT_AMBULATORY_CARE_PROVIDER_SITE_OTHER): Payer: Managed Care, Other (non HMO) | Admitting: Family Medicine

## 2020-01-05 VITALS — BP 125/75 | HR 77 | Temp 96.9°F | Resp 16 | Ht 69.0 in | Wt 203.0 lb

## 2020-01-05 DIAGNOSIS — M25512 Pain in left shoulder: Secondary | ICD-10-CM | POA: Diagnosis not present

## 2020-01-05 DIAGNOSIS — N433 Hydrocele, unspecified: Secondary | ICD-10-CM

## 2020-01-05 DIAGNOSIS — N50812 Left testicular pain: Secondary | ICD-10-CM | POA: Diagnosis not present

## 2020-01-05 DIAGNOSIS — M25511 Pain in right shoulder: Secondary | ICD-10-CM | POA: Diagnosis not present

## 2020-01-05 MED ORDER — NAPROXEN 500 MG PO TABS: 500.0000 mg | ORAL_TABLET | Freq: Two times a day (BID) | ORAL | 3 refills | Status: DC

## 2020-01-06 ENCOUNTER — Ambulatory Visit (INDEPENDENT_AMBULATORY_CARE_PROVIDER_SITE_OTHER): Payer: Managed Care, Other (non HMO) | Admitting: Urology

## 2020-01-06 ENCOUNTER — Other Ambulatory Visit: Payer: Self-pay

## 2020-01-06 ENCOUNTER — Encounter: Payer: Self-pay | Admitting: Urology

## 2020-01-06 VITALS — BP 134/79 | HR 80 | Ht 70.0 in | Wt 190.0 lb

## 2020-01-06 DIAGNOSIS — N5089 Other specified disorders of the male genital organs: Secondary | ICD-10-CM | POA: Diagnosis not present

## 2020-01-06 NOTE — Progress Notes (Signed)
01/06/2020 10:59 AM   Christopher Shannon 1960-09-29 [id number]  Referring provider: Jerrol Banana., MD [address] [place],  [place] [address]  Chief Complaint  Patient presents with  . Testicle Pain    HPI: 59 y.o. male previously seen February 2019 after being hit in the penis with a tennis ball.  He was having some suprapubic pain which resolved.  For the last year he has left hemiscrotal swelling.  No pain though occasional discomfort with changes in position.  No bothersome lower urinary tract symptoms.   PMH: Past Medical History:  Diagnosis Date  . Anxiety 10/22/2017  . Arm numbness 10/22/2017  . Arthritis   . Benign prostatic hyperplasia without urinary obstruction 10/24/2006  . Fatigue 10/22/2017  . GERD 07/06/2010   Qualifier: Diagnosis of  By: Loanne Drilling MD, Jacelyn Pi   . GERD (gastroesophageal reflux disease)    NO MEDS  . Hiatal hernia   . Irritable bowel syndrome with constipation 01/04/2015   Worse after intentional wt loss.   . Joint pain   . Umbilical hernia without obstruction and without gangrene 04/15/2017    Surgical History: Past Surgical History:  Procedure Laterality Date  . COLONOSCOPY  2011  . TONSILLECTOMY     AGE 7  . UMBILICAL HERNIA REPAIR N/A 05/20/2018   Procedure: HERNIA REPAIR UMBILICAL ADULT;  Surgeon: Florene Glen, MD;  Location: [facility] ORS;  Service: General;  Laterality: N/A;  . UPPER GI ENDOSCOPY      Home Medications:  Allergies as of 01/06/2020   No Known Allergies     Medication List       Accurate as of Jan 06, 2020 10:59 AM. If you have any questions, ask your nurse or doctor.        naproxen 500 MG tablet Commonly known as: Naprosyn Take 1 tablet (500 mg total) by mouth 2 (two) times daily with a meal.       Allergies: No Known Allergies  Family History: Family History  Problem Relation Age of Onset  . Lupus Mother   . Stomach cancer Mother   . Thyroid disease Mother   . Heart disease  Father   . Asthma Son     Social History:  reports that he has never smoked. He has never used smokeless tobacco. He reports that he does not drink alcohol or use drugs.   Physical Exam: BP 134/79   Pulse 80   Ht 5\' 10"  (1.778 m)   Wt 190 lb (86.2 kg)   BMI 27.26 kg/m   Constitutional:  Alert and oriented, No acute distress. HEENT: [place] AT, moist mucus membranes.  Trachea midline, no masses. Cardiovascular: No clubbing, cyanosis, or edema. Respiratory: Normal respiratory effort, no increased work of breathing. GI: Abdomen is soft, nontender, nondistended, no abdominal masses GU: Right testis retractile but able to bring into the scrotum.  No tenderness.  Left testis palpably normal.  Soft, left supratesticular mass with some extension into the inguinal canal Skin: No rashes, bruises or suspicious lesions. Neurologic: Grossly intact, no focal deficits, moving all 4 extremities. Psychiatric: Normal mood and affect.   Assessment & Plan:    - Left hemiscrotal mass Possible etiologies include left spermatocele, cord hydrocele and inguinal hernia.  Scrotal ultrasound ordered and he will be notified with results.  We discussed spermatocele options of observation and spermatocelectomy.   Abbie Sons, [place] [address] [place],  [address] [phone]  [phone]  

## 2020-01-22 ENCOUNTER — Emergency Department
Admission: EM | Admit: 2020-01-22 | Discharge: 2020-01-22 | Disposition: A | Discharge status: Home / Self Care | Payer: Managed Care, Other (non HMO) | Attending: Emergency Medicine | Admitting: Emergency Medicine

## 2020-01-22 ENCOUNTER — Other Ambulatory Visit: Payer: Self-pay

## 2020-01-22 DIAGNOSIS — Y999 Unspecified external cause status: Secondary | ICD-10-CM | POA: Diagnosis not present

## 2020-01-22 DIAGNOSIS — Z23 Encounter for immunization: Secondary | ICD-10-CM | POA: Diagnosis not present

## 2020-01-22 DIAGNOSIS — W458XXA Other foreign body or object entering through skin, initial encounter: Secondary | ICD-10-CM | POA: Insufficient documentation

## 2020-01-22 DIAGNOSIS — Y929 Unspecified place or not applicable: Secondary | ICD-10-CM | POA: Insufficient documentation

## 2020-01-22 DIAGNOSIS — S60455A Superficial foreign body of left ring finger, initial encounter: Secondary | ICD-10-CM | POA: Diagnosis present

## 2020-01-22 DIAGNOSIS — S6992XA Unspecified injury of left wrist, hand and finger(s), initial encounter: Secondary | ICD-10-CM

## 2020-01-22 DIAGNOSIS — Y9389 Activity, other specified: Secondary | ICD-10-CM | POA: Insufficient documentation

## 2020-01-22 MED ORDER — LIDOCAINE HCL (PF) 1 % IJ SOLN
10.0000 mL | Freq: Once | INTRAMUSCULAR | Status: AC
  Administered 2020-01-22: 10 mL
  Filled 2020-01-22: qty 10

## 2020-01-22 MED ORDER — TETANUS-DIPHTH-ACELL PERTUSSIS 5-2.5-18.5 LF-MCG/0.5 IM SUSP
0.5000 mL | Freq: Once | INTRAMUSCULAR | Status: AC
  Administered 2020-01-22: 0.5 mL via INTRAMUSCULAR
  Filled 2020-01-22: qty 0.5

## 2020-01-22 MED ORDER — AMOXICILLIN-POT CLAVULANATE 875-125 MG PO TABS: 1.0000 | ORAL_TABLET | Freq: Two times a day (BID) | ORAL | 0 refills | Status: DC

## 2020-01-22 NOTE — ED Provider Notes (Signed)
[facility] Emergency Department Provider Note  ____________________________________________  Time seen: Approximately 9:57 PM  I have reviewed the triage vital signs and the nursing notes.   HISTORY  Chief Complaint Foreign Body    HPI Christopher Shannon is a 59 y.o. male who presents the emergency department complaining of embedded fishhook in the left ring finger. Patient states that he was fishing when he accidentally had a fishhook become embedded in the region of his finger pad. Patient states that the hook was inside a fish smell, as he was attempting to secure the fish in the hook, the penetrated into his finger. Patient states that he was attempting to remove the fishhook at home. He had cut most of the hook off and was attempting to push the barb through the skin. Patient was unable to tolerate the pain and presents the emergency department. He is also concerned that he needs a tetanus shot and possible antibiotics. No other injury or complaint.         Past Medical History:  Diagnosis Date  . Anxiety 10/22/2017  . Arm numbness 10/22/2017  . Arthritis   . Benign prostatic hyperplasia without urinary obstruction 10/24/2006  . Fatigue 10/22/2017  . GERD 07/06/2010   Qualifier: Diagnosis of  By: Loanne Drilling MD, Jacelyn Pi   . GERD (gastroesophageal reflux disease)    NO MEDS  . Hiatal hernia   . Irritable bowel syndrome with constipation 01/04/2015   Worse after intentional wt loss.   . Joint pain   . Umbilical hernia without obstruction and without gangrene 04/15/2017    Patient Active Problem List   Diagnosis Date Noted  . Chest pain 10/22/2017  . Anxiety 10/22/2017  . Arm numbness 10/22/2017  . Fatigue 10/22/2017  . Muscle weakness 09/17/2017  . Umbilical hernia without obstruction and without gangrene 04/15/2017  . Irritable bowel syndrome with constipation 01/04/2015  . HYPOTHYROIDISM 07/06/2010  . DYSLIPIDEMIA 07/06/2010  . GERD 07/06/2010  .  Pruritic disorder 07/22/2007  . Benign prostatic hyperplasia without urinary obstruction 10/24/2006    Past Surgical History:  Procedure Laterality Date  . COLONOSCOPY  2011  . TONSILLECTOMY     AGE 6  . UMBILICAL HERNIA REPAIR N/A 05/20/2018   Procedure: HERNIA REPAIR UMBILICAL ADULT;  Surgeon: Florene Glen, MD;  Location: [facility] ORS;  Service: General;  Laterality: N/A;  . UPPER GI ENDOSCOPY      Prior to Admission medications   Medication Sig Start Date End Date Taking? Authorizing Provider  amoxicillin-clavulanate (AUGMENTIN) 875-125 MG tablet Take 1 tablet by mouth 2 (two) times daily. 01/22/20   Sharad Vaneaton, Charline Bills, PA-C  naproxen (NAPROSYN) 500 MG tablet Take 1 tablet (500 mg total) by mouth 2 (two) times daily with a meal. 01/05/20   Jerrol Banana., MD    Allergies Patient has no known allergies.  Family History  Problem Relation Age of Onset  . Lupus Mother   . Stomach cancer Mother   . Thyroid disease Mother   . Heart disease Father   . Asthma Son     Social History Social History   Tobacco Use  . Smoking status: Never Smoker  . Smokeless tobacco: Never Used  Substance Use Topics  . Alcohol use: No  . Drug use: No     Review of Systems  Constitutional: No fever/chills Eyes: No visual changes. No discharge ENT: No upper respiratory complaints. Cardiovascular: no chest pain. Respiratory: no cough. No SOB. Gastrointestinal: No abdominal pain.  No nausea, no vomiting.  No diarrhea.  No constipation. Musculoskeletal: Fishhook embedded in the ring finger left hand Skin: Negative for rash, abrasions, lacerations, ecchymosis. Neurological: Negative for headaches, focal weakness or numbness. 10-point ROS otherwise negative.  ____________________________________________   PHYSICAL EXAM:  VITAL SIGNS: ED Triage Vitals  Enc Vitals Group     BP 01/22/20 2050 (!) 176/87     Pulse Rate 01/22/20 2050 81     Resp 01/22/20 2050 18     Temp 01/22/20  2050 98.3 F (36.8 C)     Temp Source 01/22/20 2050 Oral     SpO2 01/22/20 2050 97 %     Weight 01/22/20 2055 190 lb (86.2 kg)     Height 01/22/20 2055 5\' 10"  (1.778 m)     Head Circumference --      Peak Flow --      Pain Score 01/22/20 2055 2     Pain Loc --      Pain Edu? --      Excl. in [place]? --      Constitutional: Alert and oriented. Well appearing and in no acute distress. Eyes: Conjunctivae are normal. PERRL. EOMI. Head: Atraumatic. ENT:      Ears:       Nose: No congestion/rhinnorhea.      Mouth/Throat: Mucous membranes are moist.  Neck: No stridor.    Cardiovascular: Normal rate, regular rhythm. Normal S1 and S2.  Good peripheral circulation. Respiratory: Normal respiratory effort without tachypnea or retractions. Lungs CTAB. Good air entry to the bases with no decreased or absent breath sounds. Musculoskeletal: Full range of motion to all extremities. No gross deformities appreciated. Visualization of the ring finger left hand reveals embedded fishhook in the distal palmar aspect of the digit. This occurs in the finger pad. Patient has visualized barb protruding through the skin as well as the proximal end of the hook. Good range of motion. No active bleeding. Sensation intact. Capillary refill less than 2 seconds. Neurologic:  Normal speech and language. No gross focal neurologic deficits are appreciated.  Skin:  Skin is warm, dry and intact. No rash noted. Psychiatric: Mood and affect are normal. Speech and behavior are normal. Patient exhibits appropriate insight and judgement.   ____________________________________________   LABS (all labs ordered are listed, but only abnormal results are displayed)  Labs Reviewed - No data to display ____________________________________________  EKG   ____________________________________________  RADIOLOGY   No results found.  ____________________________________________    PROCEDURES  Procedure(s) performed:     .Foreign Body Removal  Date/Time: 01/22/2020 10:34 PM Performed by: Darletta Moll, PA-C Authorized by: Darletta Moll, PA-C  Consent: Verbal consent obtained. Risks and benefits: risks, benefits and alternatives were discussed Consent given by: patient Patient understanding: patient states understanding of the procedure being performed Required items: required blood products, implants, devices, and special equipment available Patient identity confirmed: verbally with patient Time out: Immediately prior to procedure a "time out" was called to verify the correct patient, procedure, equipment, support staff and site/side marked as required. Body area: skin General location: upper extremity Location details: left ring finger Anesthesia: digital block  Anesthesia: Local Anesthetic: lidocaine 1% without epinephrine Anesthetic total: 6 mL  Sedation: Patient sedated: no  Patient restrained: no Patient cooperative: yes Localization method: visualized Removal mechanism: forceps Dressing: dressing applied Tendon involvement: none Depth: subcutaneous 1 objects recovered. Objects recovered: fishhook Post-procedure assessment: foreign body removed Patient tolerance: patient tolerated the procedure well with no immediate complications  Medications  lidocaine (PF) (XYLOCAINE) 1 % injection 10 mL (10 mLs Infiltration Given 01/22/20 2249)  Tdap (BOOSTRIX) injection 0.5 mL (0.5 mLs Intramuscular Given 01/22/20 2250)     ____________________________________________   INITIAL IMPRESSION / ASSESSMENT AND PLAN / ED COURSE  Pertinent labs & imaging results that were available during my care of the patient were reviewed by me and considered in my medical decision making (see chart for details).  Review of the [place] CSRS was performed in accordance of the [place] prior to dispensing any controlled drugs.           Patient's diagnosis is consistent with fishhook  injury of the left ring finger. Patient presented to emergency department with an embedded fishhook in the ring finger of the left hand. This was successfully removed as described above. Patient tolerated well. Wound care instructions discussed with the patient. Patient's tetanus shot was updated tonight. Patient is placed on antibiotics prophylactically. Return precautions are discussed with the patient. Follow-up primary care as needed..Patient is given ED precautions to return to the ED for any worsening or new symptoms.     ____________________________________________  FINAL CLINICAL IMPRESSION(S) / ED DIAGNOSES  Final diagnoses:  Fish hook injury of finger of left hand, initial encounter      NEW MEDICATIONS STARTED DURING THIS VISIT:  ED Discharge Orders         Ordered    amoxicillin-clavulanate (AUGMENTIN) 875-125 MG tablet  2 times daily     01/22/20 2233              This chart was dictated using voice recognition software/Dragon. Despite best efforts to proofread, errors can occur which can change the meaning. Any change was purely unintentional.    Darletta Moll, PA-C 01/22/20 2319    Nance Pear, MD 01/22/20 [phone]

## 2020-01-22 NOTE — ED Triage Notes (Signed)
Pt to the er for a fishhook embedded in the 4th digit of the left hand. Pt cut the hook to try and remove but was unable to remove.

## 2020-01-24 ENCOUNTER — Other Ambulatory Visit: Payer: Self-pay

## 2020-01-24 ENCOUNTER — Ambulatory Visit
Admission: RE | Admit: 2020-01-24 | Discharge: 2020-01-24 | Disposition: A | Discharge status: Home / Self Care | Payer: Managed Care, Other (non HMO) | Source: Ambulatory Visit | Attending: Urology | Admitting: Urology

## 2020-01-24 DIAGNOSIS — N5089 Other specified disorders of the male genital organs: Secondary | ICD-10-CM

## 2020-01-28 ENCOUNTER — Telehealth: Payer: Self-pay | Admitting: Family Medicine

## 2020-01-28 DIAGNOSIS — K4090 Unilateral inguinal hernia, without obstruction or gangrene, not specified as recurrent: Secondary | ICD-10-CM

## 2020-01-28 NOTE — Telephone Encounter (Signed)
Patient notified and is wanting the referral to General Surgery. He has used Dr. Phoebe Perch in  the past and is interested in using him again if possible.

## 2020-01-28 NOTE — Telephone Encounter (Signed)
-----   Message from Abbie Sons, MD sent at 01/27/2020  8:43 PM EDT ----- Scrotal ultrasound showed a left inguinal hernia.  If bothersome and he is interested in repair will refer to general surgery

## 2020-01-28 NOTE — Telephone Encounter (Signed)
LMOM informed of the scrotal hernia. He is to contact our office if he is interested in a referral to general surgery.

## 2020-02-03 NOTE — Addendum Note (Signed)
Addended by: Abbie Sons on: 02/03/2020 10:48 AM   Modules accepted: Orders

## 2020-02-03 NOTE — Telephone Encounter (Signed)
Dr. Burt Knack is no longer practicing in [place] and I am not sure where he is now.  I put in a referral to the [facility]

## 2020-02-10 ENCOUNTER — Ambulatory Visit: Payer: Managed Care, Other (non HMO) | Admitting: General Surgery

## 2020-02-17 ENCOUNTER — Encounter: Payer: Self-pay | Admitting: General Surgery

## 2020-02-17 ENCOUNTER — Ambulatory Visit (INDEPENDENT_AMBULATORY_CARE_PROVIDER_SITE_OTHER): Payer: Managed Care, Other (non HMO) | Admitting: General Surgery

## 2020-02-17 ENCOUNTER — Other Ambulatory Visit: Payer: Self-pay

## 2020-02-17 VITALS — BP 130/73 | HR 90 | Temp 97.5°F | Resp 12 | Ht 70.0 in | Wt 200.0 lb

## 2020-02-17 DIAGNOSIS — K4090 Unilateral inguinal hernia, without obstruction or gangrene, not specified as recurrent: Secondary | ICD-10-CM | POA: Diagnosis not present

## 2020-02-17 NOTE — Progress Notes (Signed)
Patient ID: Christopher Shannon, male   DOB: October 02, 1960, 59 y.o.   MRN: [id number]  Chief Complaint  Patient presents with  . New Patient (Initial Visit)    left inguinal hernia seen on scrotum US    HPI Christopher Shannon is a 59 y.o. male.   He is here today as a referral from Dr. Bernardo Heater in urology.  Mr. Christopher Shannon states that about 2 years ago, he was struck in the groin area with a tennis ball.  He initially experienced extreme pain, which gradually abated.  Over the past 2 years, he has noticed an increase swelling in his left hemiscrotum.  He was seen by Dr. Bernardo Heater who ordered a scrotal ultrasound.  A fat-containing hernia was identified.  He was subsequently referred to general surgery for further evaluation and management.  Mr. Christopher Shannon states that predominantly, the pain is most appreciated with position changes and movement.  He denies any nausea or vomiting.  His discomfort is located strictly to the left groin and does not radiate anywhere.  He does state that it is mostly discomfort, as opposed to pain.  He denies any difficulty with bowel or urinary function, aside from his enlarged prostate for which he is seen by Dr. Bernardo Heater.  He does have a prior history of umbilical hernia repair in 2019.   Past Medical History:  Diagnosis Date  . Anxiety 10/22/2017  . Arm numbness 10/22/2017  . Arthritis   . Benign prostatic hyperplasia without urinary obstruction 10/24/2006  . Fatigue 10/22/2017  . GERD 07/06/2010   Qualifier: Diagnosis of  By: Loanne Drilling MD, Jacelyn Pi   . GERD (gastroesophageal reflux disease)    NO MEDS  . Hiatal hernia   . Irritable bowel syndrome with constipation 01/04/2015   Worse after intentional wt loss.   . Joint pain   . Umbilical hernia without obstruction and without gangrene 04/15/2017    Past Surgical History:  Procedure Laterality Date  . COLONOSCOPY  2011  . TONSILLECTOMY     AGE 59  . UMBILICAL HERNIA REPAIR N/A 05/20/2018   Procedure: HERNIA REPAIR  UMBILICAL ADULT;  Surgeon: Florene Glen, MD;  Location: [facility] ORS;  Service: General;  Laterality: N/A;  . UPPER GI ENDOSCOPY      Family History  Problem Relation Age of Onset  . Lupus Mother   . Stomach cancer Mother   . Thyroid disease Mother   . Heart disease Father   . Asthma Son     Social History Social History   Tobacco Use  . Smoking status: Never Smoker  . Smokeless tobacco: Never Used  Vaping Use  . Vaping Use: Never used  Substance Use Topics  . Alcohol use: No  . Drug use: No    No Known Allergies  No current outpatient medications on file.   No current facility-administered medications for this visit.    Review of Systems Review of Systems  Eyes:       Occasional dry eyes  Respiratory: Positive for shortness of breath.        With significant exertion  Gastrointestinal:       Heartburn and indigestion, secondary to hiatal hernia  Genitourinary: Positive for frequency.  Musculoskeletal: Positive for arthralgias, back pain and neck stiffness.  Neurological: Positive for numbness.       In his hands and arms  All other systems reviewed and are negative.   Blood pressure 130/73, pulse 90, temperature (!) 97.5 F (36.4 C), temperature source  Oral, resp. rate 12, height 5\' 10"  (1.778 m), weight 200 lb (90.7 kg), SpO2 97 %. Body mass index is 28.7 kg/m.  Physical Exam Physical Exam Vitals reviewed. Exam conducted with a chaperone present.  Constitutional:      General: He is not in acute distress.    Appearance: Normal appearance. He is obese.  HENT:     Head: Normocephalic and atraumatic.     Nose:     Comments: Covered with a mask secondary to COVID-19 precautions    Mouth/Throat:     Comments: Covered with a mask secondary to COVID-19 precautions Eyes:     General: No scleral icterus.       Right eye: No discharge.        Left eye: No discharge.     Comments: Wearing glasses  Neck:     Comments: No thyromegaly or dominant thyroid  masses appreciated Cardiovascular:     Rate and Rhythm: Normal rate and regular rhythm.     Pulses: Normal pulses.     Heart sounds: No murmur heard.   Pulmonary:     Effort: Pulmonary effort is normal. No respiratory distress.     Breath sounds: Normal breath sounds.  Abdominal:     General: Bowel sounds are normal.     Palpations: Abdomen is soft.     Hernia: A hernia is present. Hernia is present in the left inguinal area.     Comments: Protuberant, consistent with his level of obesity.  Genitourinary:    Penis: Circumcised.        Comments: There is a large left hernia extending into the scrotum. Musculoskeletal:        General: No swelling or tenderness.  Lymphadenopathy:     Cervical: No cervical adenopathy.  Skin:    General: Skin is warm and dry.  Neurological:     General: No focal deficit present.     Mental Status: He is alert and oriented to person, place, and time.  Psychiatric:        Mood and Affect: Mood normal.        Behavior: Behavior normal.     Data Reviewed I reviewed Dr. Dene Gentry clinic note as well as the scrotal ultrasound that was performed.  I concur with the radiologist findings as well as Dr. Dene Gentry impression of a left fat-containing inguinal hernia.  Assessment This is a 59 year old man with a symptomatic left inguinal hernia.  I recommended that he undergo surgical repair.  Plan I have explained the procedure, risks, and aftercare of inguinal hernia repair to [name].   Risks include but are not limited to bleeding, infection, wound problems, anesthesia, recurrence, bladder or intestine injury, urinary retention, testicular dysfunction, chronic pain, mesh problems.  He  seems to understand and agrees to proceed.  Questions were answered to his stated satisfaction.  We will get him scheduled for surgery.    Christopher Shannon 02/17/2020, 1:57 PM

## 2020-02-17 NOTE — Patient Instructions (Addendum)
Our surgery scheduler will call you within 24-48 hours to schedule your surgery. Please have the Blue surgery sheet available when speaking with her.    Inguinal Hernia, Adult An inguinal hernia is when fat or your intestines push through a weak spot in a muscle where your leg meets your lower belly (groin). This causes a rounded lump (bulge). This kind of hernia could also be:  In your scrotum, if you are male.  In folds of skin around your vagina, if you are male. There are three types of inguinal hernias. These include:  Hernias that can be pushed back into the belly (are reducible). This type rarely causes pain.  Hernias that cannot be pushed back into the belly (are incarcerated).  Hernias that cannot be pushed back into the belly and lose their blood supply (are strangulated). This type needs emergency surgery. If you do not have symptoms, you may not need treatment. If you have symptoms or a large hernia, you may need surgery. Follow these instructions at home: Lifestyle  Do these things if told by your doctor so you do not have trouble pooping (constipation): ? Drink enough fluid to keep your pee (urine) pale yellow. ? Eat foods that have a lot of fiber. These include fresh fruits and vegetables, whole grains, and beans. ? Limit foods that are high in fat and processed sugars. These include foods that are fried or sweet. ? Take medicine for trouble pooping.  Avoid lifting heavy objects.  Avoid standing for long amounts of time.  Do not use any products that contain nicotine or tobacco. These include cigarettes and e-cigarettes. If you need help quitting, ask your doctor.  Stay at a healthy weight. General instructions  You may try to push your hernia in by very gently pressing on it when you are lying down. Do not try to force the bulge back in if it will not push in easily.  Watch your hernia for any changes in shape, size, or color. Tell your doctor if you see any  changes.  Take over-the-counter and prescription medicines only as told by your doctor.  Keep all follow-up visits as told by your doctor. This is important. Contact a doctor if:  You have a fever.  You have new symptoms.  Your symptoms get worse. Get help right away if:  The area where your leg meets your lower belly has: ? Pain that gets worse suddenly. ? A bulge that gets bigger suddenly, and it does not get smaller after that. ? A bulge that turns red or purple. ? A bulge that is painful when you touch it.  You are a man, and your scrotum: ? Suddenly feels painful. ? Suddenly changes in size.  You cannot push the hernia in by very gently pressing on it when you are lying down. Do not try to force the bulge back in if it will not push in easily.  You feel sick to your stomach (nauseous), and that feeling does not go away.  You throw up (vomit), and that keeps happening.  You have a fast heartbeat.  You cannot poop (have a bowel movement) or pass gas. These symptoms may be an emergency. Do not wait to see if the symptoms will go away. Get medical help right away. Call your local emergency services (911 in the [place] Summary  An inguinal hernia is when fat or your intestines push through a weak spot in a muscle where your leg meets your lower belly (groin).   This causes a rounded lump (bulge).  If you do not have symptoms, you may not need treatment. If you have symptoms or a large hernia, you may need surgery.  Avoid lifting heavy objects. Also avoid standing for long amounts of time.  Do not try to force the bulge back in if it will not push in easily. This information is not intended to replace advice given to you by your health care provider. Make sure you discuss any questions you have with your health care provider. Document Revised: 09/20/2017 Document Reviewed: 05/21/2017 Elsevier Patient Education  2020 [employer]  

## 2020-02-21 ENCOUNTER — Telehealth: Payer: Self-pay | Admitting: General Surgery

## 2020-02-21 NOTE — Telephone Encounter (Signed)
Received call back from the patient.  He is now aware of all dates regarding his surgery and voices understanding.

## 2020-02-21 NOTE — Telephone Encounter (Signed)
Outgoing call is made, left message for patient to call.  Please advise patient of Pre-Admission date/time, COVID Testing date and Surgery date.  Surgery Date: 04/05/20 Preadmission Testing Date: 03/24/20 (phone 1p-5p) Covid Testing Date: 04/03/20 - patient advised to go to the [facility] ([facility]) between 8a-1p   Also patient needs to call [phone], between 1-3:00pm the day before surgery, to find out what time to arrive for surgery.

## 2020-03-24 ENCOUNTER — Other Ambulatory Visit: Payer: Self-pay

## 2020-03-24 ENCOUNTER — Encounter
Admission: RE | Admit: 2020-03-24 | Discharge: 2020-03-24 | Disposition: A | Discharge status: Home / Self Care | Payer: Managed Care, Other (non HMO) | Source: Ambulatory Visit | Attending: General Surgery | Admitting: General Surgery

## 2020-03-24 NOTE — Patient Instructions (Signed)
Your procedure is scheduled on: Wednesday April 05, 2020. Report to Day Surgery inside [facility] 2nd floor. To find out your arrival time please call [phone] between 1PM - 3PM on Tuesday April 04, 2020.  Remember: Instructions that are not followed completely may result in serious medical risk,  up to and including death, or upon the discretion of your surgeon and anesthesiologist your  surgery may need to be rescheduled.     _X__ 1. Do not eat food after midnight the night before your procedure.                 No gum chewing or hard candies. You may drink clear liquids up to 2 hours                 before you are scheduled to arrive for your surgery- DO not drink clear                 liquids within 2 hours of the start of your surgery.                 Clear Liquids include:  water, apple juice without pulp, clear Gatorade, G2 or                  Gatorade Zero (avoid Red/Purple/Blue), Black Coffee or Tea (Do not add                 anything to coffee or tea).  __X__2.  On the morning of surgery brush your teeth with toothpaste and water, you                may rinse your mouth with mouthwash if you wish.  Do not swallow any toothpaste of mouthwash.     _X__ 3.  No Alcohol for 24 hours before or after surgery.   _X__ 4.  Do Not Smoke or use e-cigarettes For 24 Hours Prior to Your Surgery.                 Do not use any chewable tobacco products for at least 6 hours prior to                 Surgery.  _X__  5.  Do not use any recreational drugs (marijuana, cocaine, heroin, ecstacy, MDMA or other)                For at least one week prior to your surgery.  Combination of these drugs with anesthesia                May have life threatening results.  __X__ 6.  Notify your doctor if there is any change in your medical condition      (cold, fever, infections).     Do not wear jewelry, make-up, hairpins, clips or nail polish. Do not wear lotions, powders,  or perfumes. You may wear deodorant. Do not shave 48 hours prior to surgery. Men may shave face and neck. Do not bring valuables to the hospital.    [facility] is not responsible for any belongings or valuables.  Contacts, dentures or bridgework may not be worn into surgery. Leave your suitcase in the car. After surgery it may be brought to your room. For patients admitted to the hospital, discharge time is determined by your treatment team.   Patients discharged the day of surgery will not be allowed to drive home.   Make arrangements for someone to be  with you for the first 24 hours of your Same Day Discharge.   ____ Take these medicines the morning of surgery with A SIP OF WATER:    1. None   __X__ Use CHG Soap (or wipes) as directed  __X__ Stop Anti-inflammatories ibuprofen (ADVIL),meloxicam (MOBIC), Aleve, naproxen, aspirin and or BC powders.     ____ Stop supplements until after surgery.    __X__ Do not start any herbal supplements before your surgery.

## 2020-04-03 ENCOUNTER — Other Ambulatory Visit: Payer: Self-pay

## 2020-04-03 ENCOUNTER — Other Ambulatory Visit
Admission: RE | Admit: 2020-04-03 | Discharge: 2020-04-03 | Disposition: A | Discharge status: Home / Self Care | Payer: Managed Care, Other (non HMO) | Source: Ambulatory Visit | Attending: General Surgery | Admitting: General Surgery

## 2020-04-03 DIAGNOSIS — Z20822 Contact with and (suspected) exposure to covid-19: Secondary | ICD-10-CM | POA: Insufficient documentation

## 2020-04-03 DIAGNOSIS — Z01812 Encounter for preprocedural laboratory examination: Secondary | ICD-10-CM | POA: Diagnosis not present

## 2020-04-03 LAB — SARS CORONAVIRUS 2 (TAT 6-24 HRS): SARS Coronavirus 2: NEGATIVE

## 2020-04-05 ENCOUNTER — Ambulatory Visit: Payer: Managed Care, Other (non HMO) | Admitting: Anesthesiology

## 2020-04-05 ENCOUNTER — Ambulatory Visit
Admission: RE | Admit: 2020-04-05 | Discharge: 2020-04-05 | Disposition: A | Discharge status: Home / Self Care | Payer: Managed Care, Other (non HMO) | Attending: General Surgery | Admitting: General Surgery

## 2020-04-05 ENCOUNTER — Encounter
Admission: RE | Disposition: A | Discharge status: Home / Self Care | Payer: Self-pay | Source: Home / Self Care | Attending: General Surgery

## 2020-04-05 ENCOUNTER — Encounter: Payer: Self-pay | Admitting: General Surgery

## 2020-04-05 ENCOUNTER — Other Ambulatory Visit: Payer: Self-pay

## 2020-04-05 DIAGNOSIS — D176 Benign lipomatous neoplasm of spermatic cord: Secondary | ICD-10-CM | POA: Insufficient documentation

## 2020-04-05 DIAGNOSIS — K4090 Unilateral inguinal hernia, without obstruction or gangrene, not specified as recurrent: Secondary | ICD-10-CM | POA: Diagnosis present

## 2020-04-05 DIAGNOSIS — N400 Enlarged prostate without lower urinary tract symptoms: Secondary | ICD-10-CM | POA: Diagnosis not present

## 2020-04-05 HISTORY — PX: INGUINAL HERNIA REPAIR: SHX194

## 2020-04-05 SURGERY — REPAIR, HERNIA, INGUINAL, ADULT
Anesthesia: General | Laterality: Left

## 2020-04-05 MED ORDER — CHLORHEXIDINE GLUCONATE 0.12 % MT SOLN
15.0000 mL | Freq: Once | OROMUCOSAL | Status: AC
  Administered 2020-04-05: 15 mL via OROMUCOSAL

## 2020-04-05 MED ORDER — FENTANYL CITRATE (PF) 100 MCG/2ML IJ SOLN: 25.0000 ug | INTRAMUSCULAR | Status: DC | PRN

## 2020-04-05 MED ORDER — KETOROLAC TROMETHAMINE 30 MG/ML IJ SOLN
INTRAMUSCULAR | Status: AC
  Filled 2020-04-05: qty 1

## 2020-04-05 MED ORDER — FAMOTIDINE 20 MG PO TABS
20.0000 mg | ORAL_TABLET | Freq: Once | ORAL | Status: AC
  Administered 2020-04-05: 20 mg via ORAL

## 2020-04-05 MED ORDER — LIDOCAINE-EPINEPHRINE 1 %-1:100000 IJ SOLN
INTRAMUSCULAR | Status: AC
  Filled 2020-04-05: qty 1

## 2020-04-05 MED ORDER — DEXAMETHASONE SODIUM PHOSPHATE 10 MG/ML IJ SOLN
INTRAMUSCULAR | Status: AC
  Filled 2020-04-05: qty 1

## 2020-04-05 MED ORDER — OXYCODONE HCL 5 MG/5ML PO SOLN: 5.0000 mg | Freq: Once | ORAL | Status: AC | PRN

## 2020-04-05 MED ORDER — PROPOFOL 10 MG/ML IV BOLUS
INTRAVENOUS | Status: AC
  Filled 2020-04-05: qty 20

## 2020-04-05 MED ORDER — ONDANSETRON HCL 4 MG/2ML IJ SOLN: 4.0000 mg | Freq: Once | INTRAMUSCULAR | Status: DC | PRN

## 2020-04-05 MED ORDER — OXYCODONE HCL 5 MG PO TABS
ORAL_TABLET | ORAL | Status: AC
  Administered 2020-04-05: 5 mg via ORAL
  Filled 2020-04-05: qty 1

## 2020-04-05 MED ORDER — BUPIVACAINE HCL (PF) 0.25 % IJ SOLN
INTRAMUSCULAR | Status: AC
  Filled 2020-04-05: qty 30

## 2020-04-05 MED ORDER — CEFAZOLIN SODIUM-DEXTROSE 2-4 GM/100ML-% IV SOLN
INTRAVENOUS | Status: AC
  Filled 2020-04-05: qty 100

## 2020-04-05 MED ORDER — ROCURONIUM BROMIDE 100 MG/10ML IV SOLN
INTRAVENOUS | Status: DC | PRN
  Administered 2020-04-05: 20 mg via INTRAVENOUS
  Administered 2020-04-05: 50 mg via INTRAVENOUS

## 2020-04-05 MED ORDER — ACETAMINOPHEN 10 MG/ML IV SOLN: 1000.0000 mg | Freq: Once | INTRAVENOUS | Status: DC | PRN

## 2020-04-05 MED ORDER — MIDAZOLAM HCL 2 MG/2ML IJ SOLN
INTRAMUSCULAR | Status: DC | PRN
  Administered 2020-04-05: 2 mg via INTRAVENOUS

## 2020-04-05 MED ORDER — SUGAMMADEX SODIUM 200 MG/2ML IV SOLN
INTRAVENOUS | Status: DC | PRN
  Administered 2020-04-05: 177 mg via INTRAVENOUS

## 2020-04-05 MED ORDER — OXYCODONE HCL 5 MG PO TABS: 5.0000 mg | ORAL_TABLET | Freq: Once | ORAL | Status: AC | PRN

## 2020-04-05 MED ORDER — ORAL CARE MOUTH RINSE: 15.0000 mL | Freq: Once | OROMUCOSAL | Status: AC

## 2020-04-05 MED ORDER — LIDOCAINE-EPINEPHRINE 1 %-1:100000 IJ SOLN
INTRAMUSCULAR | Status: DC | PRN
  Administered 2020-04-05: 16 mL via INTRAMUSCULAR

## 2020-04-05 MED ORDER — CELECOXIB 200 MG PO CAPS
200.0000 mg | ORAL_CAPSULE | ORAL | Status: AC
  Administered 2020-04-05: 200 mg via ORAL

## 2020-04-05 MED ORDER — BUPIVACAINE LIPOSOME 1.3 % IJ SUSP
INTRAMUSCULAR | Status: AC
  Filled 2020-04-05: qty 20

## 2020-04-05 MED ORDER — DOCUSATE SODIUM 100 MG PO CAPS
100.0000 mg | ORAL_CAPSULE | Freq: Two times a day (BID) | ORAL | 0 refills | Status: AC
Start: 2020-04-05 — End: 2020-05-05

## 2020-04-05 MED ORDER — GABAPENTIN 300 MG PO CAPS
ORAL_CAPSULE | ORAL | Status: AC
  Filled 2020-04-05: qty 1

## 2020-04-05 MED ORDER — LIDOCAINE HCL (PF) 2 % IJ SOLN
INTRAMUSCULAR | Status: AC
  Filled 2020-04-05: qty 5

## 2020-04-05 MED ORDER — PROPOFOL 10 MG/ML IV BOLUS
INTRAVENOUS | Status: DC | PRN
  Administered 2020-04-05: 150 mg via INTRAVENOUS

## 2020-04-05 MED ORDER — GABAPENTIN 300 MG PO CAPS
300.0000 mg | ORAL_CAPSULE | ORAL | Status: AC
  Administered 2020-04-05: 300 mg via ORAL

## 2020-04-05 MED ORDER — DEXMEDETOMIDINE HCL IN NACL 400 MCG/100ML IV SOLN
INTRAVENOUS | Status: DC | PRN
Start: 2020-04-05 — End: 2020-04-05
  Administered 2020-04-05: 4 ug via INTRAVENOUS

## 2020-04-05 MED ORDER — LIDOCAINE HCL (CARDIAC) PF 100 MG/5ML IV SOSY
PREFILLED_SYRINGE | INTRAVENOUS | Status: DC | PRN
  Administered 2020-04-05: 80 mg via INTRAVENOUS

## 2020-04-05 MED ORDER — CHLORHEXIDINE GLUCONATE CLOTH 2 % EX PADS: 6.0000 | MEDICATED_PAD | Freq: Once | CUTANEOUS | Status: DC

## 2020-04-05 MED ORDER — CEFAZOLIN SODIUM-DEXTROSE 2-4 GM/100ML-% IV SOLN
2.0000 g | INTRAVENOUS | Status: AC
  Administered 2020-04-05: 2 g via INTRAVENOUS

## 2020-04-05 MED ORDER — ACETAMINOPHEN 500 MG PO TABS
1000.0000 mg | ORAL_TABLET | ORAL | Status: AC
  Administered 2020-04-05: 1000 mg via ORAL

## 2020-04-05 MED ORDER — CHLORHEXIDINE GLUCONATE CLOTH 2 % EX PADS
6.0000 | MEDICATED_PAD | Freq: Once | CUTANEOUS | Status: AC
  Administered 2020-04-05: 6 via TOPICAL

## 2020-04-05 MED ORDER — ONDANSETRON HCL 4 MG/2ML IJ SOLN
INTRAMUSCULAR | Status: DC | PRN
  Administered 2020-04-05: 4 mg via INTRAVENOUS

## 2020-04-05 MED ORDER — CELECOXIB 200 MG PO CAPS
ORAL_CAPSULE | ORAL | Status: AC
  Filled 2020-04-05: qty 1

## 2020-04-05 MED ORDER — KETOROLAC TROMETHAMINE 30 MG/ML IJ SOLN
30.0000 mg | Freq: Once | INTRAMUSCULAR | Status: AC
  Administered 2020-04-05: 30 mg via INTRAVENOUS

## 2020-04-05 MED ORDER — FENTANYL CITRATE (PF) 100 MCG/2ML IJ SOLN
INTRAMUSCULAR | Status: DC | PRN
  Administered 2020-04-05 (×2): 50 ug via INTRAVENOUS

## 2020-04-05 MED ORDER — DEXMEDETOMIDINE HCL IN NACL 80 MCG/20ML IV SOLN
INTRAVENOUS | Status: AC
  Filled 2020-04-05: qty 20

## 2020-04-05 MED ORDER — DEXAMETHASONE SODIUM PHOSPHATE 10 MG/ML IJ SOLN
INTRAMUSCULAR | Status: DC | PRN
  Administered 2020-04-05: 10 mg via INTRAVENOUS

## 2020-04-05 MED ORDER — CHLORHEXIDINE GLUCONATE 0.12 % MT SOLN
OROMUCOSAL | Status: AC
  Filled 2020-04-05: qty 15

## 2020-04-05 MED ORDER — BUPIVACAINE LIPOSOME 1.3 % IJ SUSP
INTRAMUSCULAR | Status: DC | PRN
  Administered 2020-04-05: 20 mL

## 2020-04-05 MED ORDER — ACETAMINOPHEN 500 MG PO TABS
ORAL_TABLET | ORAL | Status: AC
  Filled 2020-04-05: qty 2

## 2020-04-05 MED ORDER — ONDANSETRON HCL 4 MG/2ML IJ SOLN
INTRAMUSCULAR | Status: AC
  Filled 2020-04-05: qty 2

## 2020-04-05 MED ORDER — LACTATED RINGERS IV SOLN: INTRAVENOUS | Status: DC

## 2020-04-05 MED ORDER — FENTANYL CITRATE (PF) 100 MCG/2ML IJ SOLN
INTRAMUSCULAR | Status: AC
  Filled 2020-04-05: qty 2

## 2020-04-05 MED ORDER — MIDAZOLAM HCL 2 MG/2ML IJ SOLN
INTRAMUSCULAR | Status: AC
  Filled 2020-04-05: qty 2

## 2020-04-05 MED ORDER — BUPIVACAINE LIPOSOME 1.3 % IJ SUSP: 20.0000 mL | Freq: Once | INTRAMUSCULAR | Status: DC

## 2020-04-05 MED ORDER — IBUPROFEN 200 MG PO TABS: 600.0000 mg | ORAL_TABLET | Freq: Four times a day (QID) | ORAL | 0 refills | Status: DC | PRN

## 2020-04-05 MED ORDER — HYDROCODONE-ACETAMINOPHEN 5-325 MG PO TABS: 1.0000 | ORAL_TABLET | Freq: Four times a day (QID) | ORAL | 0 refills | Status: DC | PRN

## 2020-04-05 MED ORDER — FAMOTIDINE 20 MG PO TABS
ORAL_TABLET | ORAL | Status: AC
  Filled 2020-04-05: qty 1

## 2020-04-05 SURGICAL SUPPLY — 43 items
ADH SKN CLS APL DERMABOND .7 (GAUZE/BANDAGES/DRESSINGS) ×1
APL PRP STRL LF DISP 70% ISPRP (MISCELLANEOUS) ×1
APPLIER CLIP 9.375 SM OPEN (CLIP) ×2
APR CLP SM 9.3 20 MLT OPN (CLIP) ×1
BLADE CLIPPER SURG (BLADE) ×2 IMPLANT
CANISTER SUCT 1200ML W/VALVE (MISCELLANEOUS) ×2 IMPLANT
CHLORAPREP W/TINT 26 (MISCELLANEOUS) ×2 IMPLANT
CLIP APPLIE 9.375 SM OPEN (CLIP) IMPLANT
COVER WAND RF STERILE (DRAPES) ×2 IMPLANT
DERMABOND ADVANCED (GAUZE/BANDAGES/DRESSINGS) ×1
DERMABOND ADVANCED .7 DNX12 (GAUZE/BANDAGES/DRESSINGS) ×1 IMPLANT
DRAIN PENROSE 5/8X18 LTX STRL (DRAIN) ×2 IMPLANT
DRAPE LAPAROTOMY 77X122 PED (DRAPES) ×2 IMPLANT
ELECT CAUTERY BLADE TIP 2.5 (TIP) ×2
ELECT REM PT RETURN 9FT ADLT (ELECTROSURGICAL) ×2
ELECTRODE CAUTERY BLDE TIP 2.5 (TIP) ×1 IMPLANT
ELECTRODE REM PT RTRN 9FT ADLT (ELECTROSURGICAL) ×1 IMPLANT
GLOVE BIO SURGEON STRL SZ 6.5 (GLOVE) ×2 IMPLANT
GLOVE INDICATOR 7.0 STRL GRN (GLOVE) ×4 IMPLANT
GOWN STRL REUS W/ TWL LRG LVL3 (GOWN DISPOSABLE) ×2 IMPLANT
GOWN STRL REUS W/TWL LRG LVL3 (GOWN DISPOSABLE) ×4
KIT TURNOVER KIT A (KITS) ×2 IMPLANT
LABEL OR SOLS (LABEL) ×2 IMPLANT
MESH HERNIA 6X12 ULTRAPRO MED (Mesh General) IMPLANT
MESH HERNIA ULTRAPRO MED (Mesh General) ×1 IMPLANT
NEEDLE HYPO 22GX1.5 SAFETY (NEEDLE) ×4 IMPLANT
NS IRRIG 500ML POUR BTL (IV SOLUTION) ×2 IMPLANT
PACK BASIN MINOR (MISCELLANEOUS) ×2 IMPLANT
SPONGE KITTNER 5P (MISCELLANEOUS) ×2 IMPLANT
SPONGE LAP 18X18 RF (DISPOSABLE) ×2 IMPLANT
STRIP CLOSURE SKIN 1/2X4 (GAUZE/BANDAGES/DRESSINGS) ×2 IMPLANT
SUT ETHIBOND NAB MO 7 #0 18IN (SUTURE) ×2 IMPLANT
SUT MNCRL 4-0 (SUTURE) ×2
SUT MNCRL 4-0 27XMFL (SUTURE) ×1
SUT PROLENE 2 0 SH DA (SUTURE) ×1 IMPLANT
SUT VIC AB 2-0 SH 27 (SUTURE) ×2
SUT VIC AB 2-0 SH 27XBRD (SUTURE) IMPLANT
SUT VIC AB 3-0 SH 27 (SUTURE) ×2
SUT VIC AB 3-0 SH 27X BRD (SUTURE) ×1 IMPLANT
SUTURE MNCRL 4-0 27XMF (SUTURE) ×1 IMPLANT
SYR 10ML LL (SYRINGE) ×2 IMPLANT
SYR 20ML LL LF (SYRINGE) ×2 IMPLANT
SYR BULB IRRIG 60ML STRL (SYRINGE) ×2 IMPLANT

## 2020-04-05 NOTE — Op Note (Signed)
Hernia, Open, Procedure Note  Indications: The patient presented with a history of a left, not reducible inguinal hernia.    Pre-operative Diagnosis: left not reducible inguinal hernia  Post-operative Diagnosis: same  Surgeon: [name]   Assistants: Arvilla Meres, RNFA  Anesthesia: General endotracheal anesthesia  ASA Class: 2  Procedure Details  The patient was seen again in the Holding Room. The risks, benefits, complications, treatment options, and expected outcomes were discussed with the patient. The possibilities of reaction to medication, pulmonary aspiration, perforation of viscus, bleeding, recurrent infection, the need for additional procedures, and development of a complication requiring transfusion or further operation were discussed with the patient and/or family. There was concurrence with the proposed plan, and informed consent was obtained. The site of surgery was properly noted/marked. The patient was taken to the Operating Room, identified as Christopher Shannon, and the procedure verified as hernia repair. A Time Out was held and the above information confirmed.  The patient was placed in the supine position and underwent induction of anesthesia. The lower abdomen and groin was prepped and draped in the standard fashion.a one-to-one mixture of 0.25% bupivacaine and 1% lidocaine with epinephrine was used to anesthetize the skin and subcutaneous tissues over the mid-portion of the inguinal canal. A transverse incision was made. Dissection was carried through the soft tissue to expose the inguinal canal and inguinal ligament along its lower edge. The external oblique fascia was split along the course of its fibers, exposing the inguinal canal. The cord and nerve were looped using a Penrose drain and reflected out of the field.  There was a tremendous amount of fat within the hernia.  The cremasteric fibers were stripped away from the sac.  There was a small cord lipoma that  was excised and handed off.  We freed the hernia and fat away from the cord structures and dissected them back to the internal ring.  There was too much fat present to reduce into the abdominal cavity and therefore it was divided between clamps and ties.  The remaining fat was able to be reduced through the hernia.  The defect was exposed and a medium Ultra Pro mesh was used to repair the defect.  The circular component was inserted through the hernia, taking care that the disc lay flat without bunching.  The overlay was then trimmed to size and placed over the floor of the inguinal canal.  It was sutured to the pubic tubercle using a 0 Ethibond suture.  It was then secured to the shelving edge of the inguinal ligament laterally and the conjoined tendon medially using interrupted 0 Ethibond. The mesh was split to allow passage of the cord and nerve into the canal without entrapment. The contents were then returned to canal. The external oblique fascia was closed in a continuous fashion using 2-0 Vicryl suture, taking care not to cause entrapment of the nerve.  Liposomal bupivacaine was then infiltrated along the fascial planes.  Scarpa's fascia was closed with interrupted 3-0 Vicryl, as was the deep dermis.  Running subcuticular Monocryl was used to close the skin.  The skin was cleaned.  Dermabond and Steri-Strips were applied.  The left testicle was confirmed to be within the scrotum.  The patient was then awakened, extubated, and taken to the postanesthesia care unit in good condition.  Instrument, sponge, and needle counts were correct prior to closure and at the conclusion of the case.  Findings: Hernia as above  Estimated Blood Loss: Minimal  Drains: None         Total IV Fluids: See anesthesia record         Specimens: None         Complications: None immediately apparent; patient tolerated the procedure well.         Disposition: Stable to PACU with subsequent discharge to home

## 2020-04-05 NOTE — Anesthesia Procedure Notes (Signed)
Procedure Name: Intubation Date/Time: 04/05/2020 8:38 AM Performed by: Jerrye Noble, CRNA Pre-anesthesia Checklist: Patient identified, Emergency Drugs available, Suction available and Patient being monitored Patient Re-evaluated:Patient Re-evaluated prior to induction Oxygen Delivery Method: Circle system utilized Preoxygenation: Pre-oxygenation with 100% oxygen Induction Type: IV induction Ventilation: Mask ventilation without difficulty Laryngoscope Size: McGraph and 4 Grade View: Grade I Tube type: Oral Tube size: 7.0 mm Number of attempts: 1 Airway Equipment and Method: Stylet,  Oral airway and Video-laryngoscopy Placement Confirmation: ETT inserted through vocal cords under direct vision,  positive ETCO2 and breath sounds checked- equal and bilateral Secured at: 22 cm Tube secured with: Tape Dental Injury: Teeth and Oropharynx as per pre-operative assessment

## 2020-04-05 NOTE — Progress Notes (Addendum)
   04/05/20 0800  Clinical Encounter Type  Visited With Family  Visit Type Initial  Referral From Chaplain  Consult/Referral To Chaplain  While rounding in [facility] waiting area, chaplain spoke with patient's wife, checking to make sure she was fine while waiting. Patient's wife said she was was fine. Chaplain said she pray all is well with patient and wished her well.
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
## 2020-04-05 NOTE — Anesthesia Preprocedure Evaluation (Addendum)
Anesthesia Evaluation  Patient identified by MRN, date of birth, ID band Patient awake    Reviewed: Allergy & Precautions, NPO status , Patient's Chart, lab work & pertinent test results  History of Anesthesia Complications Negative for: history of anesthetic complications  Airway Mallampati: III  TM Distance: >3 FB Neck ROM: Full   Comment: Prior grade 3 view with Mac 4 blade Dental no notable dental hx. (+) Teeth Intact, Dental Advisory Given   Pulmonary neg sleep apnea, neg COPD, Not current smoker,    Pulmonary exam normal breath sounds clear to auscultation       Cardiovascular Exercise Tolerance: Good (-) hypertension(-) Past MI and (-) CHF (-) dysrhythmias (-) Valvular Problems/Murmurs Rhythm:Regular Rate:Normal - Systolic murmurs    Neuro/Psych neg Seizures Anxiety    GI/Hepatic Neg liver ROS, hiatal hernia, GERD (rare)  Controlled,  Endo/Other  neg diabetesHypothyroidism (borderline, no meds)   Renal/GU negative Renal ROS     Musculoskeletal   Abdominal   Peds  Hematology   Anesthesia Other Findings Past Medical History: 10/22/2017: Anxiety 10/22/2017: Arm numbness No date: Arthritis 10/24/2006: Benign prostatic hyperplasia without urinary obstruction 10/22/2017: Fatigue 07/06/2010: GERD     Comment:  Qualifier: Diagnosis of  By: Loanne Drilling MD, Hilliard Clark A  No date: GERD (gastroesophageal reflux disease)     Comment:  NO MEDS No date: Hiatal hernia 01/04/2015: Irritable bowel syndrome with constipation     Comment:  Worse after intentional wt loss.  No date: Joint pain 7/97/2820: Umbilical hernia without obstruction and without gangrene   Reproductive/Obstetrics                            Anesthesia Physical  Anesthesia Plan  ASA: II  Anesthesia Plan: General   Post-op Pain Management:    Induction: Intravenous  PONV Risk Score and Plan: 3 and Dexamethasone, Ondansetron and  Midazolam  Airway Management Planned: LMA  Additional Equipment: None  Intra-op Plan:   Post-operative Plan: Extubation in OR  Informed Consent: I have reviewed the patients History and Physical, chart, labs and discussed the procedure including the risks, benefits and alternatives for the proposed anesthesia with the patient or authorized representative who has indicated his/her understanding and acceptance.     Dental advisory given  Plan Discussed with: CRNA and Surgeon  Anesthesia Plan Comments: (Discussed risks of anesthesia with patient, including PONV, sore throat, lip/dental damage. Rare risks discussed as well, such as cardiorespiratory and neurological sequelae. Patient understands.)       Anesthesia Quick Evaluation

## 2020-04-05 NOTE — Discharge Instructions (Addendum)
Bupivacaine Liposomal Suspension for Injection °What is this medicine? °BUPIVACAINE LIPOSOMAL (bue PIV a kane LIP oh som al) is an anesthetic. It causes loss of feeling in the skin or other tissues. It is used to prevent and to treat pain from some procedures. °This medicine may be used for other purposes; ask your health care provider or pharmacist if you have questions. °COMMON BRAND NAME(S): EXPAREL °What should I tell my health care provider before I take this medicine? °They need to know if you have any of these conditions: °· G6PD deficiency °· heart disease °· kidney disease °· liver disease °· low blood pressure °· lung or breathing disease, like asthma °· an unusual or allergic reaction to bupivacaine, other medicines, foods, dyes, or preservatives °· pregnant or trying to get pregnant °· breast-feeding °How should I use this medicine? °This medicine is for injection into the affected area. It is given by a health care professional in a hospital or clinic setting. °Talk to your pediatrician regarding the use of this medicine in children. Special care may be needed. °Overdosage: If you think you have taken too much of this medicine contact a poison control center or emergency room at once. °NOTE: This medicine is only for you. Do not share this medicine with others. °What if I miss a dose? °This does not apply. °What may interact with this medicine? °This medicine may interact with the following medications: °· acetaminophen °· certain antibiotics like dapsone, nitrofurantoin, aminosalicylic acid, sulfonamides °· certain medicines for seizures like phenobarbital, phenytoin, valproic acid °· chloroquine °· cyclophosphamide °· flutamide °· hydroxyurea °· ifosfamide °· metoclopramide °· nitric oxide °· nitroglycerin °· nitroprusside °· nitrous oxide °· other local anesthetics like lidocaine, pramoxine, tetracaine °· primaquine °· quinine °· rasburicase °· sulfasalazine °This list may not describe all possible  interactions. Give your health care provider a list of all the medicines, herbs, non-prescription drugs, or dietary supplements you use. Also tell them if you smoke, drink alcohol, or use illegal drugs. Some items may interact with your medicine. °What should I watch for while using this medicine? °Your condition will be monitored carefully while you are receiving this medicine. °Be careful to avoid injury while the area is numb, and you are not aware of pain. °What side effects may I notice from receiving this medicine? °Side effects that you should report to your doctor or health care professional as soon as possible: °· allergic reactions like skin rash, itching or hives, swelling of the face, lips, or tongue °· seizures °· signs and symptoms of a dangerous change in heartbeat or heart rhythm like chest pain; dizziness; fast, irregular heartbeat; palpitations; feeling faint or lightheaded; falls; breathing problems °· signs and symptoms of methemoglobinemia such as pale, gray, or blue colored skin; headache; fast heartbeat; shortness of breath; feeling faint or lightheaded, falls; tiredness °Side effects that usually do not require medical attention (report to your doctor or health care professional if they continue or are bothersome): °· anxious °· back pain °· changes in taste °· changes in vision °· constipation °· dizziness °· fever °· nausea, vomiting °This list may not describe all possible side effects. Call your doctor for medical advice about side effects. You may report side effects to FDA at 1-800-FDA-1088. °Where should I keep my medicine? °This drug is given in a hospital or clinic and will not be stored at home. °NOTE: This sheet is a summary. It may not cover all possible information. If you have questions about this   medicine, talk to your doctor, pharmacist, or health care provider. °© 2020 [employer] (2019-06-01 10:48:23) ° ° °AMBULATORY SURGERY  °DISCHARGE INSTRUCTIONS ° ° °1) The  drugs that you were given will stay in your system until tomorrow so for the next 24 hours you should not: ° °A) Drive an automobile °B) Make any legal decisions °C) Drink any alcoholic beverage ° ° °2) You may resume regular meals tomorrow.  Today it is better to start with liquids and gradually work up to solid foods. ° °You may eat anything you prefer, but it is better to start with liquids, then soup and crackers, and gradually work up to solid foods. ° ° °3) Please notify your doctor immediately if you have any unusual bleeding, trouble breathing, redness and pain at the surgery site, drainage, fever, or pain not relieved by medication. ° ° ° °4) Additional Instructions: ° ° ° ° ° ° ° °Please contact your physician with any problems or Same Day Surgery at [phone], Monday through Friday 6 am to 4 pm, or [place] Main number at [phone]. °

## 2020-04-05 NOTE — Transfer of Care (Signed)
Immediate Anesthesia Transfer of Care Note  Patient: Christopher Shannon  Procedure(s) Performed: HERNIA REPAIR INGUINAL ADULT, open (Left )  Patient Location: PACU  Anesthesia Type:General  Level of Consciousness: drowsy  Airway & Oxygen Therapy: Patient Spontanous Breathing and Patient connected to face mask oxygen  Post-op Assessment: Report given to RN and Post -op Vital signs reviewed and stable  Post vital signs: Reviewed and stable  Last Vitals:  Vitals Value Taken Time  BP 107/76 04/05/20 1021  Temp    Pulse 67 04/05/20 1024  Resp 19 04/05/20 1024  SpO2 99 % 04/05/20 1024  Vitals shown include unvalidated device data.  Last Pain:  Vitals:   04/05/20 0753  TempSrc: Temporal  PainSc: 0-No pain         Complications: No complications documented.

## 2020-04-05 NOTE — Anesthesia Postprocedure Evaluation (Signed)
Anesthesia Post Note  Patient: Christopher Shannon [facility]  Procedure(s) Performed: HERNIA REPAIR INGUINAL ADULT, open (Left )  Patient location during evaluation: PACU Anesthesia Type: General Level of consciousness: awake and alert Pain management: pain level controlled Vital Signs Assessment: post-procedure vital signs reviewed and stable Respiratory status: spontaneous breathing, nonlabored ventilation, respiratory function stable and patient connected to nasal cannula oxygen Cardiovascular status: blood pressure returned to baseline and stable Postop Assessment: no apparent nausea or vomiting Anesthetic complications: no   No complications documented.   Last Vitals:  Vitals:   04/05/20 1121 04/05/20 1135  BP: 133/79 (!) 142/77  Pulse: (!) 59 65  Resp: 15 16  Temp: (!) 36.1 C (!) 35.9 C  SpO2: 100% 99%    Last Pain:  Vitals:   04/05/20 1218  TempSrc:   PainSc: 4                  Arita Miss

## 2020-04-06 ENCOUNTER — Encounter: Payer: Self-pay | Admitting: General Surgery

## 2020-04-19 ENCOUNTER — Other Ambulatory Visit: Payer: Self-pay

## 2020-04-19 ENCOUNTER — Ambulatory Visit (INDEPENDENT_AMBULATORY_CARE_PROVIDER_SITE_OTHER): Payer: Managed Care, Other (non HMO) | Admitting: Physician Assistant

## 2020-04-19 ENCOUNTER — Encounter: Payer: Self-pay | Admitting: Physician Assistant

## 2020-04-19 VITALS — BP 135/84 | HR 90 | Temp 98.6°F | Resp 12 | Ht 70.0 in | Wt 198.6 lb

## 2020-04-19 DIAGNOSIS — K4090 Unilateral inguinal hernia, without obstruction or gangrene, not specified as recurrent: Secondary | ICD-10-CM

## 2020-04-19 DIAGNOSIS — Z09 Encounter for follow-up examination after completed treatment for conditions other than malignant neoplasm: Secondary | ICD-10-CM

## 2020-04-19 NOTE — Progress Notes (Signed)
[facility] POST-OP OFFICE VISIT  04/19/2020  HPI: Christopher Shannon is a 59 y.o. male 14 days s/p open left inguinal hernia repair with Dr Celine Ahr.   Overall he is doing well He still reports some swelling in the left inguinal region and into the scrotum. He has intermittent pain with certain movements but this is managed with ibuprofen alone.  No fever, chills, nausea, emesis, or bowel changes He has been following post-op restrictions well No other issues.   Vital signs: BP 135/84   Pulse 90   Temp 98.6 F (37 C)   Resp 12   Ht 5\' 10"  (1.778 m)   Wt 198 lb 9.6 oz (90.1 kg)   SpO2 97%   BMI 28.50 kg/m    Physical Exam: Constitutional: Well appearing male, NAD Abdomen: Soft, some soreness in left groin, non-distended, no rebound or guarding Genitourinary: Both testicles are palpable and descended, no evidence of tethering, there is some swelling present Skin: Left inguinal incision is CDI with dermabond, no erythema or drainage   Assessment/Plan: This is a 59 y.o. male 14 days s/p open left inguinal hernia repair   - Pain control prn with ibuprofen prn  - Recommended scrotal support   - Reviewed wound care  - Reviewed lifting restrictions  - RTC prn at this point, he will call with questions or concerns.  -- [name], PA-C [facility] 04/19/2020, 11:00 AM [phone] M-F: 7am - 4pm

## 2020-04-19 NOTE — Patient Instructions (Signed)
Continue to take Ibuprofen as needed and keep icing the area to help reduce swelling.   Follow up as needed, call the office if you have any questions or concerns.   GENERAL POST-OPERATIVE PATIENT INSTRUCTIONS   WOUND CARE INSTRUCTIONS:  Keep a dry clean dressing on the wound if there is drainage. The initial bandage may be removed after 24 hours.  Once the wound has quit draining you may leave it open to air.  If clothing rubs against the wound or causes irritation and the wound is not draining you may cover it with a dry dressing during the daytime.  Try to keep the wound dry and avoid ointments on the wound unless directed to do so.  If the wound becomes bright red and painful or starts to drain infected material that is not clear, please contact your physician immediately.  If the wound is mildly pink and has a thick firm ridge underneath it, this is normal, and is referred to as a healing ridge.  This will resolve over the next 4-6 weeks.  BATHING: You may shower if you have been informed of this by your surgeon. However, Please do not submerge in a tub, hot tub, or pool until incisions are completely sealed or have been told by your surgeon that you may do so.  DIET:  You may eat any foods that you can tolerate.  It is a good idea to eat a high fiber diet and take in plenty of fluids to prevent constipation.  If you do become constipated you may want to take a mild laxative or take ducolax tablets on a daily basis until your bowel habits are regular.  Constipation can be very uncomfortable, along with straining, after recent surgery.  ACTIVITY:  You are encouraged to cough and deep breath or use your incentive spirometer if you were given one, every 15-30 minutes when awake.  This will help prevent respiratory complications and low grade fevers post-operatively if you had a general anesthetic.  You may want to hug a pillow when coughing and sneezing to add additional support to the surgical area,  if you had abdominal or chest surgery, which will decrease pain during these times.  You are encouraged to walk and engage in light activity for the next two weeks.  You should not lift more than 10 pounds, 6 weeks after surgery as it could put you at increased risk for complications.  Twenty pounds is roughly equivalent to a plastic bag of groceries. At that time- Listen to your body when lifting, if you have pain when lifting, stop and then try again in a few days. Soreness after doing exercises or activities of daily living is normal as you get back in to your normal routine.  MEDICATIONS:  Try to take narcotic medications and anti-inflammatory medications, such as tylenol, ibuprofen, naprosyn, etc., with food.  This will minimize stomach upset from the medication.  Should you develop nausea and vomiting from the pain medication, or develop a rash, please discontinue the medication and contact your physician.  You should not drive, make important decisions, or operate machinery when taking narcotic pain medication.  SUNBLOCK Use sun block to incision area over the next year if this area will be exposed to sun. This helps decrease scarring and will allow you avoid a permanent darkened area over your incision.  QUESTIONS:  Please feel free to call our office if you have any questions, and we will be glad to assist you.

## 2020-04-21 ENCOUNTER — Telehealth: Payer: Self-pay | Admitting: General Surgery

## 2020-04-21 NOTE — Telephone Encounter (Signed)
Incoming call from patient.  He is having shoulder MRI scheduled for 04/24/20 and wants to be sure that ok to do since had hernia surgery recently.  Did check with Dr. Celine Ahr about this and patient informed ok to proceed with shoulder MRI.

## 2020-05-26 ENCOUNTER — Encounter: Payer: Self-pay | Admitting: General Surgery

## 2020-07-03 ENCOUNTER — Ambulatory Visit (INDEPENDENT_AMBULATORY_CARE_PROVIDER_SITE_OTHER): Payer: Managed Care, Other (non HMO) | Admitting: Family Medicine

## 2020-07-03 ENCOUNTER — Encounter: Payer: Self-pay | Admitting: Family Medicine

## 2020-07-03 ENCOUNTER — Other Ambulatory Visit: Payer: Self-pay

## 2020-07-03 VITALS — BP 106/52 | HR 74 | Temp 98.4°F | Ht 70.0 in | Wt 182.0 lb

## 2020-07-03 DIAGNOSIS — R319 Hematuria, unspecified: Secondary | ICD-10-CM

## 2020-07-03 DIAGNOSIS — N390 Urinary tract infection, site not specified: Secondary | ICD-10-CM

## 2020-07-03 LAB — POCT URINALYSIS DIPSTICK
Bilirubin, UA: NEGATIVE
Glucose, UA: NEGATIVE
Ketones, UA: NEGATIVE
Nitrite, UA: NEGATIVE
Protein, UA: NEGATIVE
Spec Grav, UA: 1.01 (ref 1.010–1.025)
Urobilinogen, UA: 0.2 E.U./dL
pH, UA: 5 (ref 5.0–8.0)

## 2020-07-03 MED ORDER — SULFAMETHOXAZOLE-TRIMETHOPRIM 800-160 MG PO TABS: 1.0000 | ORAL_TABLET | Freq: Two times a day (BID) | ORAL | 0 refills | Status: DC

## 2020-07-03 NOTE — Progress Notes (Signed)
Established patient visit   Patient: Christopher Shannon   DOB: December 10, 1960   59 y.o. Male  MRN: [id number] Visit Date: 07/03/2020  Today's healthcare provider: Vernie Murders, PA   Chief Complaint  Patient presents with  . Urinary Tract Infection   Subjective      HPI    As per pt wanted to notify that had surgery on 06/6268 for UMBILICAL HERNIA REPAIR; [place] don't know if is contributing his UTI also noted blood in the urine As per patient not hydrating himself very well --HA on Friday --thirsty --constipation--little dizzy    Last edited by Jeanelle Malling, CMA on 07/03/2020  2:08 PM. (History)        Past Medical History:  Diagnosis Date  . Anxiety 10/22/2017  . Arm numbness 10/22/2017  . Arthritis   . Benign prostatic hyperplasia without urinary obstruction 10/24/2006  . Fatigue 10/22/2017  . GERD 07/06/2010   Qualifier: Diagnosis of  By: Loanne Drilling MD, Jacelyn Pi   . GERD (gastroesophageal reflux disease)    NO MEDS  . Hiatal hernia   . Irritable bowel syndrome with constipation 01/04/2015   Worse after intentional wt loss.   . Joint pain   . Umbilical hernia without obstruction and without gangrene 04/15/2017   Past Surgical History:  Procedure Laterality Date  . COLONOSCOPY  2011  . INGUINAL HERNIA REPAIR Left 04/05/2020   Procedure: HERNIA REPAIR INGUINAL ADULT, open;  Surgeon: [name], MD;  Location: [facility] ORS;  Service: General;  Laterality: Left;  . TONSILLECTOMY     AGE 21  . UMBILICAL HERNIA REPAIR N/A 05/20/2018   Procedure: HERNIA REPAIR UMBILICAL ADULT;  Surgeon: Florene Glen, MD;  Location: [facility] ORS;  Service: General;  Laterality: N/A;  . UPPER GI ENDOSCOPY     Social History   Tobacco Use  . Smoking status: Never Smoker  . Smokeless tobacco: Never Used  Vaping Use  . Vaping Use: Never used  Substance Use Topics  . Alcohol use: No  . Drug use: No   Family Status  Relation Name Status  . Mother  Deceased  . Father   Deceased  . Sister  Alive  . Brother  Alive  . Sister  Alive  . Daughter  Alive  . Son  Alive  . Daughter  Alive   No Known Allergies     Medications: Outpatient Medications Prior to Visit  Medication Sig  . ibuprofen (ADVIL) 200 MG tablet Take 3 tablets (600 mg total) by mouth every 6 (six) hours as needed for headache, mild pain or moderate pain.   No facility-administered medications prior to visit.    Review of Systems    Objective    BP (!) 106/52   Pulse 74   Temp 98.4 F (36.9 C) (Oral)   Ht 5\' 10"  (1.778 m)   Wt 182 lb (82.6 kg)   SpO2 99%   BMI 26.11 kg/m    Physical Exam Constitutional:      General: He is not in acute distress.    Appearance: He is well-developed.  HENT:     Head: Normocephalic and atraumatic.     Right Ear: Hearing normal.     Left Ear: Hearing normal.     Nose: Nose normal.  Eyes:     General: Lids are normal. No scleral icterus.       Right eye: No discharge.        Left eye: No  discharge.     Conjunctiva/sclera: Conjunctivae normal.  Cardiovascular:     Rate and Rhythm: Normal rate and regular rhythm.     Heart sounds: Normal heart sounds.  Pulmonary:     Effort: Pulmonary effort is normal. No respiratory distress.     Breath sounds: Normal breath sounds.  Abdominal:     General: Bowel sounds are normal.     Palpations: Abdomen is soft.     Tenderness: There is no abdominal tenderness. There is no right CVA tenderness or left CVA tenderness.  Musculoskeletal:        General: Normal range of motion.     Cervical back: Neck supple.  Skin:    Findings: No lesion or rash.  Neurological:     Mental Status: He is alert and oriented to person, place, and time.  Psychiatric:        Speech: Speech normal.        Behavior: Behavior normal.        Thought Content: Thought content normal.     Results for orders placed or performed in visit on 07/03/20  POCT Urinalysis Dipstick  Result Value Ref Range   Color, UA dark  amber    Clarity, UA clear    Glucose, UA Negative Negative   Bilirubin, UA Negative    Ketones, UA Negative    Spec Grav, UA 1.010 1.010 - 1.025   Blood, UA large    pH, UA 5.0 5.0 - 8.0   Protein, UA Negative Negative   Urobilinogen, UA 0.2 0.2 or 1.0 E.U./dL   Nitrite, UA Negative    Leukocytes, UA 4+ (A) Negative   Appearance     Odor foul     Assessment & Plan     1. Urinary tract infection with hematuria, site unspecified Developed urinary frequency and penile discomfort with urination over the past 3 days. No fever or gross hematuria. Urinalysis showed many WBC's with RBC's and some bacteria. Will start antibiotics and may add AZO-Standard prn discomfort. Increase fluid intake and recheck pending C&S report. - POCT Urinalysis Dipstick - CULTURE, URINE COMPREHENSIVE - sulfamethoxazole-trimethoprim (BACTRIM DS) 800-160 MG tablet; Take 1 tablet by mouth 2 (two) times daily.  Dispense: 20 tablet; Refill: 0   Return if symptoms worsen or fail to improve.      Andres Shad, PA, have reviewed all documentation for this visit. The documentation on 07/03/20 for the exam, diagnosis, procedures, and orders are all accurate and complete.    Vernie Murders, [place] [phone] (phone) [phone] (fax)  [place]

## 2020-07-05 ENCOUNTER — Telehealth: Payer: Self-pay

## 2020-07-05 NOTE — Telephone Encounter (Signed)
Urine culture results are still pending. I called and spoke with patient and advised him of this. Patient will call back tomorrow to see if results are back.

## 2020-07-05 NOTE — Telephone Encounter (Signed)
Copied from [place] [id number]. Topic: General - Inquiry >> Jul 05, 2020  2:02 PM Alease Frame wrote: Reason for CRM:Pt called in for lab results . No phone call or VM was left . No messages in chart . Please advise

## 2020-07-07 LAB — CULTURE, URINE COMPREHENSIVE

## 2020-07-19 NOTE — Progress Notes (Signed)
I,[name],acting as a scribe for Wilhemena Durie, MD.,have documented all relevant documentation on the behalf of Wilhemena Durie, MD,as directed by  Wilhemena Durie, MD while in the presence of Wilhemena Durie, MD.   Complete physical exam   Patient: Christopher Shannon   DOB: 19-Jul-1961   59 y.o. Male  MRN: [id number] Visit Date: 07/20/2020  Today's healthcare provider: Wilhemena Durie, MD   Chief Complaint  Patient presents with  . Annual Exam   Subjective    Christopher Shannon is a 59 y.o. male who presents today for a complete physical exam.  He reports consuming a general diet. Walks almost everyday. He generally feels well. He reports sleeping fairly well. He does not have additional problems to discuss today.  HPI  Patient has 3 months of thoracic back pain after he is had long periods of time sitting or standing.  Resolves when he changes position. He has symptoms of bilateral carpal tunnel syndrome with tingling in his hands. Has left lateral thigh pain at times.  Sensory loss. Past Medical History:  Diagnosis Date  . Anxiety 10/22/2017  . Arm numbness 10/22/2017  . Arthritis   . Benign prostatic hyperplasia without urinary obstruction 10/24/2006  . Fatigue 10/22/2017  . GERD 07/06/2010   Qualifier: Diagnosis of  By: Loanne Drilling MD, Jacelyn Pi   . GERD (gastroesophageal reflux disease)    NO MEDS  . Hiatal hernia   . Irritable bowel syndrome with constipation 01/04/2015   Worse after intentional wt loss.   . Joint pain   . Umbilical hernia without obstruction and without gangrene 04/15/2017   Past Surgical History:  Procedure Laterality Date  . COLONOSCOPY  2011  . INGUINAL HERNIA REPAIR Left 04/05/2020   Procedure: HERNIA REPAIR INGUINAL ADULT, open;  Surgeon: [name], MD;  Location: [facility] ORS;  Service: General;  Laterality: Left;  . TONSILLECTOMY     AGE 64  . UMBILICAL HERNIA REPAIR N/A 05/20/2018   Procedure: HERNIA REPAIR UMBILICAL  ADULT;  Surgeon: Florene Glen, MD;  Location: [facility] ORS;  Service: General;  Laterality: N/A;  . UPPER GI ENDOSCOPY     Social History   Socioeconomic History  . Marital status: Married    Spouse name: Not on file  . Number of children: Not on file  . Years of education: Not on file  . Highest education level: Not on file  Occupational History  . Not on file  Tobacco Use  . Smoking status: Never Smoker  . Smokeless tobacco: Never Used  Vaping Use  . Vaping Use: Never used  Substance and Sexual Activity  . Alcohol use: No  . Drug use: No  . Sexual activity: Not on file  Other Topics Concern  . Not on file  Social History Narrative  . Not on file   Social Determinants of Health   Financial Resource Strain:   . Difficulty of Paying Living Expenses: Not on file  Food Insecurity:   . Worried About Charity fundraiser in the Last Year: Not on file  . Ran Out of Food in the Last Year: Not on file  Transportation Needs:   . Lack of Transportation (Medical): Not on file  . Lack of Transportation (Non-Medical): Not on file  Physical Activity:   . Days of Exercise per Week: Not on file  . Minutes of Exercise per Session: Not on file  Stress:   . Feeling of Stress : Not  on file  Social Connections:   . Frequency of Communication with Friends and Family: Not on file  . Frequency of Social Gatherings with Friends and Family: Not on file  . Attends Religious Services: Not on file  . Active Member of Clubs or Organizations: Not on file  . Attends Archivist Meetings: Not on file  . Marital Status: Not on file  Intimate Partner Violence:   . Fear of Current or Ex-Partner: Not on file  . Emotionally Abused: Not on file  . Physically Abused: Not on file  . Sexually Abused: Not on file   Family Status  Relation Name Status  . Mother  Deceased  . Father  Deceased  . Sister  Alive  . Brother  Alive  . Sister  Alive  . Daughter  Alive  . Son  Alive  . Daughter   Alive   Family History  Problem Relation Age of Onset  . Lupus Mother   . Stomach cancer Mother   . Thyroid disease Mother   . Heart disease Father   . Asthma Son    No Known Allergies  Patient Care Team: Jerrol Banana., MD as PCP - General (Family Medicine) Jerrol Banana., MD (Family Medicine) Bary Castilla Forest Gleason, MD (General Surgery)   Medications: Outpatient Medications Prior to Visit  Medication Sig  . ibuprofen (ADVIL) 200 MG tablet Take 3 tablets (600 mg total) by mouth every 6 (six) hours as needed for headache, mild pain or moderate pain.  Marland Kitchen sulfamethoxazole-trimethoprim (BACTRIM DS) 800-160 MG tablet Take 1 tablet by mouth 2 (two) times daily. (Patient not taking: Reported on 07/20/2020)   No facility-administered medications prior to visit.    Review of Systems  Eyes: Positive for photophobia.  Gastrointestinal: Positive for constipation.  Endocrine: Positive for polyuria.  Musculoskeletal: Positive for back pain.  All other systems reviewed and are negative.      Objective    BP 112/65 (BP Location: Left Arm, Patient Position: Sitting, Cuff Size: Large)   Pulse 69   Temp 98.4 F (36.9 C) (Oral)   Resp 16   Ht 5\' 10"  (1.778 m)   Wt 179 lb (81.2 kg)   SpO2 100%   BMI 25.68 kg/m  BP Readings from Last 3 Encounters:  07/20/20 112/65  07/03/20 (!) 106/52  04/19/20 135/84   Wt Readings from Last 3 Encounters:  07/20/20 179 lb (81.2 kg)  07/03/20 182 lb (82.6 kg)  04/19/20 198 lb 9.6 oz (90.1 kg)      Physical Exam Vitals reviewed.  Constitutional:      Appearance: He is well-developed.  HENT:     Head: Normocephalic and atraumatic.     Right Ear: External ear normal.     Left Ear: External ear normal.     Nose: Nose normal.  Eyes:     Conjunctiva/sclera: Conjunctivae normal.     Pupils: Pupils are equal, round, and reactive to light.  Cardiovascular:     Rate and Rhythm: Normal rate and regular rhythm.     Heart sounds:  Normal heart sounds.  Pulmonary:     Effort: Pulmonary effort is normal.     Breath sounds: Normal breath sounds.  Abdominal:     General: Bowel sounds are normal.     Palpations: Abdomen is soft.  Genitourinary:    Penis: Normal.      Prostate: Normal.     Rectum: Normal. Guaiac result negative.  Comments: Possible right hydrocele.  Certainly no hernia noted. Musculoskeletal:        General: Normal range of motion.     Cervical back: Normal range of motion and neck supple.  Skin:    General: Skin is warm and dry.  Neurological:     Mental Status: He is alert and oriented to person, place, and time.  Psychiatric:        Mood and Affect: Mood normal.        Behavior: Behavior normal.        Thought Content: Thought content normal.        Judgment: Judgment normal.       Last depression screening scores PHQ 2/9 Scores 07/20/2020 07/08/2019 07/02/2018  PHQ - 2 Score 0 0 0  PHQ- 9 Score 0 - -   Last fall risk screening Fall Risk  07/20/2020  Falls in the past year? 0  Number falls in past yr: 0  Injury with Fall? 0  Follow up Falls evaluation completed   Last Audit-C alcohol use screening Alcohol Use Disorder Test (AUDIT) 07/20/2020  1. How often do you have a drink containing alcohol? 0  2. How many drinks containing alcohol do you have on a typical day when you are drinking? 0  3. How often do you have six or more drinks on one occasion? -  AUDIT-C Score -  Alcohol Brief Interventions/Follow-up AUDIT Score <7 follow-up not indicated   A score of 3 or more in women, and 4 or more in men indicates increased risk for alcohol abuse, EXCEPT if all of the points are from question 1   No results found for any visits on 07/20/20.  Assessment & Plan    Routine Health Maintenance and Physical Exam  Exercise Activities and Dietary recommendations Goals   None     Immunization History  Administered Date(s) Administered  . Td 11/12/2003, 12/03/2011  . Tdap  01/22/2020    Health Maintenance  Topic Date Due  . HIV Screening  Never done  . INFLUENZA VACCINE  Never done  . COLONOSCOPY  06/11/2020  . TETANUS/TDAP  01/21/2030  . Hepatitis C Screening  Completed    Discussed health benefits of physical activity, and encouraged him to engage in regular exercise appropriate for his age and condition.  1. Annual physical exam  - Lipid panel - TSH - CBC w/Diff/Platelet - Comprehensive Metabolic Panel (CMET)  2. Benign prostatic hyperplasia without urinary obstruction Per urology referral. - PSA  3. Screening for blood or protein in urine  - POCT urinalysis dipstick--Abnormal  4. Screening for prostate cancer  - PSA  5. Encounter for screening fecal occult blood testing  - IFOBT POC (occult bld, rslt in office); Negative  6.  Bilateral carpal tunnel syndrome Try cock-up wrist splints at night  7.  Muscular thoracic back pain  Return in about 1 year (around 07/20/2021) for CPE.        Bethsaida Siegenthaler Cranford Mon, MD  [facility] [phone] (phone) [phone] (fax)  [place]

## 2020-07-20 ENCOUNTER — Ambulatory Visit (INDEPENDENT_AMBULATORY_CARE_PROVIDER_SITE_OTHER): Payer: Managed Care, Other (non HMO) | Admitting: Family Medicine

## 2020-07-20 ENCOUNTER — Other Ambulatory Visit: Payer: Self-pay

## 2020-07-20 ENCOUNTER — Encounter: Payer: Self-pay | Admitting: Family Medicine

## 2020-07-20 VITALS — BP 112/65 | HR 69 | Temp 98.4°F | Resp 16 | Ht 70.0 in | Wt 179.0 lb

## 2020-07-20 DIAGNOSIS — M546 Pain in thoracic spine: Secondary | ICD-10-CM

## 2020-07-20 DIAGNOSIS — Z1389 Encounter for screening for other disorder: Secondary | ICD-10-CM

## 2020-07-20 DIAGNOSIS — G5603 Carpal tunnel syndrome, bilateral upper limbs: Secondary | ICD-10-CM

## 2020-07-20 DIAGNOSIS — N400 Enlarged prostate without lower urinary tract symptoms: Secondary | ICD-10-CM

## 2020-07-20 DIAGNOSIS — Z125 Encounter for screening for malignant neoplasm of prostate: Secondary | ICD-10-CM

## 2020-07-20 DIAGNOSIS — Z1211 Encounter for screening for malignant neoplasm of colon: Secondary | ICD-10-CM | POA: Diagnosis not present

## 2020-07-20 DIAGNOSIS — G8929 Other chronic pain: Secondary | ICD-10-CM

## 2020-07-20 DIAGNOSIS — Z0000 Encounter for general adult medical examination without abnormal findings: Secondary | ICD-10-CM | POA: Diagnosis not present

## 2020-07-20 NOTE — Patient Instructions (Signed)

## 2020-07-21 LAB — CBC WITH DIFFERENTIAL/PLATELET
Basophils Absolute: 0 10*3/uL (ref 0.0–0.2)
Basos: 1 %
EOS (ABSOLUTE): 0.1 10*3/uL (ref 0.0–0.4)
Eos: 3 %
Hematocrit: 46.2 % (ref 37.5–51.0)
Hemoglobin: 14.8 g/dL (ref 13.0–17.7)
Immature Grans (Abs): 0 10*3/uL (ref 0.0–0.1)
Immature Granulocytes: 0 %
Lymphocytes Absolute: 1.4 10*3/uL (ref 0.7–3.1)
Lymphs: 30 %
MCH: 27.3 pg (ref 26.6–33.0)
MCHC: 32 g/dL (ref 31.5–35.7)
MCV: 85 fL (ref 79–97)
Monocytes Absolute: 0.4 10*3/uL (ref 0.1–0.9)
Monocytes: 10 %
Neutrophils Absolute: 2.6 10*3/uL (ref 1.4–7.0)
Neutrophils: 56 %
Platelets: 246 10*3/uL (ref 150–450)
RBC: 5.42 x10E6/uL (ref 4.14–5.80)
RDW: 13.9 % (ref 11.6–15.4)
WBC: 4.6 10*3/uL (ref 3.4–10.8)

## 2020-07-21 LAB — COMPREHENSIVE METABOLIC PANEL
ALT: 21 IU/L (ref 0–44)
AST: 18 IU/L (ref 0–40)
Albumin/Globulin Ratio: 2.6 — ABNORMAL HIGH (ref 1.2–2.2)
Albumin: 4.4 g/dL (ref 3.8–4.9)
Alkaline Phosphatase: 56 IU/L (ref 44–121)
BUN/Creatinine Ratio: 14 (ref 10–24)
BUN: 14 mg/dL (ref 8–27)
Bilirubin Total: 0.7 mg/dL (ref 0.0–1.2)
CO2: 26 mmol/L (ref 20–29)
Calcium: 9.2 mg/dL (ref 8.6–10.2)
Chloride: 100 mmol/L (ref 96–106)
Creatinine, Ser: 1.02 mg/dL (ref 0.76–1.27)
GFR calc Af Amer: 92 mL/min/{1.73_m2} (ref >59)
GFR calc non Af Amer: 80 mL/min/{1.73_m2} (ref >59)
Globulin, Total: 1.7 g/dL (ref 1.5–4.5)
Glucose: 91 mg/dL (ref 65–99)
Potassium: 4.5 mmol/L (ref 3.5–5.2)
Sodium: 139 mmol/L (ref 134–144)
Total Protein: 6.1 g/dL (ref 6.0–8.5)

## 2020-07-21 LAB — LIPID PANEL
Chol/HDL Ratio: 3.5 ratio (ref 0.0–5.0)
Cholesterol, Total: 192 mg/dL (ref 100–199)
HDL: 55 mg/dL (ref >39)
LDL Chol Calc (NIH): 123 mg/dL — ABNORMAL HIGH (ref 0–99)
Triglycerides: 76 mg/dL (ref 0–149)
VLDL Cholesterol Cal: 14 mg/dL (ref 5–40)

## 2020-07-21 LAB — PSA: Prostate Specific Ag, Serum: 2.2 ng/mL (ref 0.0–4.0)

## 2020-07-21 LAB — TSH: TSH: 3.77 u[IU]/mL (ref 0.450–4.500)

## 2020-07-25 LAB — IFOBT (OCCULT BLOOD): IFOBT: NEGATIVE

## 2020-07-25 LAB — POCT URINALYSIS DIPSTICK
Bilirubin, UA: NEGATIVE
Blood, UA: NEGATIVE
Glucose, UA: NEGATIVE
Ketones, UA: NEGATIVE
Nitrite, UA: NEGATIVE
Protein, UA: NEGATIVE
Spec Grav, UA: 1.01 (ref 1.010–1.025)
Urobilinogen, UA: 0.2 E.U./dL
pH, UA: 6 (ref 5.0–8.0)

## 2020-10-09 ENCOUNTER — Telehealth: Payer: Self-pay

## 2020-10-09 NOTE — Telephone Encounter (Signed)
Copied from [place] [id number]. Topic: General - Other >> Oct 09, 2020  1:12 PM Wynetta Emery, [place] C wrote: Reason for CRM: pt called in for assistance. Pt says that he is 60 years old and hasn't discussed having a colonoscopy. Pt would like to have assistance with getting a colonoscopy. ALSO, pt says that he has had a Hernia on 2 occassions and has had mesh put in him, pt would like to make sure that mesh will not effect hernia.        CB: [phone]-- please advise/assist

## 2020-10-10 NOTE — Telephone Encounter (Signed)
10 years since last--please refer for colonoscopy.

## 2020-10-11 ENCOUNTER — Other Ambulatory Visit: Payer: Self-pay | Admitting: *Deleted

## 2020-10-11 DIAGNOSIS — Z1211 Encounter for screening for malignant neoplasm of colon: Secondary | ICD-10-CM

## 2020-10-11 NOTE — Telephone Encounter (Addendum)
GI referral ordered. Per [name] patient needs to contact surgeon who did mesh/hernia repair in August 2021.

## 2020-10-11 NOTE — Telephone Encounter (Signed)
LMOVM for pt to return call 

## 2020-10-12 NOTE — Telephone Encounter (Signed)
Per patient, left detailed message on vm.

## 2020-10-12 NOTE — Telephone Encounter (Signed)
yes

## 2020-10-12 NOTE — Telephone Encounter (Signed)
LMOVM for pt to return call 

## 2020-10-12 NOTE — Telephone Encounter (Signed)
Patient was advised. Patient's question concerning hernia mesh was if it will be safe to do colonoscopy with 2 mesh's in place? Please advise?

## 2020-11-28 NOTE — Progress Notes (Signed)
This encounter was created in error - please disregard.

## 2021-03-07 ENCOUNTER — Telehealth: Payer: Self-pay | Admitting: Family Medicine

## 2021-03-07 NOTE — Telephone Encounter (Signed)
Reviewed the order, and it was placed for screening colonoscopy to GI in 10/2020. It is not listed that we referred the patient to discuss Acid Reflux. Christopher Shannon, who should we send this to? The referral coordinators or discuss w/Dr. Jacqulyn Liner office etc.? Please advise. Thanks!

## 2021-03-07 NOTE — Telephone Encounter (Signed)
Patient was advised by Sherrill Raring, Dr Jacqulyn Liner office he was referred for acid reflux and not a preventative colonoscopy. Patient received a bill and would like a follow up a call. Patient states it should have been a routine colonoscopy  Copied from [place] [id number]. Topic: General - Other >> Mar 02, 2021 11:19 AM Celene Kras wrote: Reason for CRM: Pt called stating that he had a prep visit with Dr. Jacqulyn Liner at [facility] in May. He states that Jefm Bryant is stating that the visit was an acid reflux condition visit. He states that he is being charged for the visit even though this should have been completely covered as a follow up colonscopy. Pt is requesting to speak with referral coordinator to find out some more info. Please advise.

## 2021-03-12 NOTE — Telephone Encounter (Signed)
Contacted the patient and asked him to contact Dr. Jacqulyn Liner.

## 2021-04-19 LAB — HM COLONOSCOPY

## 2021-06-13 ENCOUNTER — Encounter: Payer: Self-pay | Admitting: General Surgery

## 2021-07-23 ENCOUNTER — Other Ambulatory Visit: Payer: Self-pay

## 2021-07-23 ENCOUNTER — Encounter: Payer: Self-pay | Admitting: Family Medicine

## 2021-07-23 ENCOUNTER — Ambulatory Visit (INDEPENDENT_AMBULATORY_CARE_PROVIDER_SITE_OTHER): Payer: Managed Care, Other (non HMO) | Admitting: Family Medicine

## 2021-07-23 VITALS — BP 123/71 | HR 70 | Temp 98.0°F | Wt 183.5 lb

## 2021-07-23 DIAGNOSIS — Z0000 Encounter for general adult medical examination without abnormal findings: Secondary | ICD-10-CM

## 2021-07-23 DIAGNOSIS — Z13220 Encounter for screening for lipoid disorders: Secondary | ICD-10-CM | POA: Diagnosis not present

## 2021-07-23 DIAGNOSIS — N400 Enlarged prostate without lower urinary tract symptoms: Secondary | ICD-10-CM

## 2021-07-23 DIAGNOSIS — Z125 Encounter for screening for malignant neoplasm of prostate: Secondary | ICD-10-CM | POA: Diagnosis not present

## 2021-07-23 DIAGNOSIS — Z136 Encounter for screening for cardiovascular disorders: Secondary | ICD-10-CM

## 2021-07-23 DIAGNOSIS — R319 Hematuria, unspecified: Secondary | ICD-10-CM

## 2021-07-23 DIAGNOSIS — N390 Urinary tract infection, site not specified: Secondary | ICD-10-CM

## 2021-07-23 LAB — POCT URINALYSIS DIPSTICK
Bilirubin, UA: NEGATIVE
Blood, UA: NEGATIVE
Glucose, UA: NEGATIVE
Ketones, UA: NEGATIVE
Nitrite, UA: NEGATIVE
Protein, UA: NEGATIVE
Spec Grav, UA: <=1.005 — AB (ref 1.010–1.025)
Urobilinogen, UA: 0.2 E.U./dL
pH, UA: 5 (ref 5.0–8.0)

## 2021-07-23 MED ORDER — SULFAMETHOXAZOLE-TRIMETHOPRIM 800-160 MG PO TABS: 1.0000 | ORAL_TABLET | Freq: Two times a day (BID) | ORAL | 0 refills | Status: DC

## 2021-07-23 NOTE — Progress Notes (Signed)
Complete physical exam   Patient: Christopher Shannon   DOB: 12/20/1960   60 y.o. Male  MRN: [id number] Visit Date: 07/23/2021  Today's healthcare provider: Wilhemena Durie, MD   Chief Complaint  Patient presents with   Annual Exam    Subjective    Christopher Shannon is a 60 y.o. male who presents today for a complete physical exam.  He reports consuming a general diet. The patient does not participate in regular exercise at present. He generally feels well. He reports sleeping well. He does not have additional problems to discuss today.  He is back to playing tennis some.  Overall he feels very well. HPI  Hap Colonoscopy done 04/19/2021 Declined Influenza vaccine. He states he had a possible UTI recently and he still has a little bit of odor to his urine with mild dysuria on occasion.  It is improving. Past Medical History:  Diagnosis Date   Anxiety 10/22/2017   Arm numbness 10/22/2017   Arthritis    Benign prostatic hyperplasia without urinary obstruction 10/24/2006   Fatigue 10/22/2017   GERD 07/06/2010   Qualifier: Diagnosis of  By: Loanne Drilling MD, Hilliard Clark A    GERD (gastroesophageal reflux disease)    NO MEDS   Hiatal hernia    Irritable bowel syndrome with constipation 01/04/2015   Worse after intentional wt loss.    Joint pain    Umbilical hernia without obstruction and without gangrene 04/15/2017   Past Surgical History:  Procedure Laterality Date   COLONOSCOPY  2011   INGUINAL HERNIA REPAIR Left 04/05/2020   Procedure: HERNIA REPAIR INGUINAL ADULT, open;  Surgeon: [name], MD;  Location: [facility] ORS;  Service: General;  Laterality: Left;   TONSILLECTOMY     AGE 41   UMBILICAL HERNIA REPAIR N/A 05/20/2018   Procedure: HERNIA REPAIR UMBILICAL ADULT;  Surgeon: Florene Glen, MD;  Location: [facility] ORS;  Service: General;  Laterality: N/A;   UPPER GI ENDOSCOPY     Social History   Socioeconomic History   Marital status: Married    Spouse name: Not on file    Number of children: Not on file   Years of education: Not on file   Highest education level: Not on file  Occupational History   Not on file  Tobacco Use   Smoking status: Never   Smokeless tobacco: Never  Vaping Use   Vaping Use: Never used  Substance and Sexual Activity   Alcohol use: No   Drug use: No   Sexual activity: Not on file  Other Topics Concern   Not on file  Social History Narrative   Not on file   Social Determinants of Health   Financial Resource Strain: Not on file  Food Insecurity: Not on file  Transportation Needs: Not on file  Physical Activity: Not on file  Stress: Not on file  Social Connections: Not on file  Intimate Partner Violence: Not on file   Family Status  Relation Name Status   Mother  Deceased   Father  Deceased   Sister  Alive   Brother  Alive   Sister  Alive   Daughter  Alive   Son  Alive   Daughter  Alive   Family History  Problem Relation Age of Onset   Lupus Mother    Stomach cancer Mother    Thyroid disease Mother    Heart disease Father    Asthma Son    No Known Allergies  Patient  Care Team: Jerrol Banana., MD as PCP - General (Family Medicine) Jerrol Banana., MD (Family Medicine) Bary Castilla Forest Gleason, MD (General Surgery)   Medications: Outpatient Medications Prior to Visit  Medication Sig   ibuprofen (ADVIL) 200 MG tablet Take 3 tablets (600 mg total) by mouth every 6 (six) hours as needed for headache, mild pain or moderate pain.   [DISCONTINUED] sulfamethoxazole-trimethoprim (BACTRIM DS) 800-160 MG tablet Take 1 tablet by mouth 2 (two) times daily.   No facility-administered medications prior to visit.    Review of Systems  Constitutional: Negative.   HENT: Negative.    Eyes: Negative.   Respiratory: Negative.    Cardiovascular: Negative.   Gastrointestinal: Negative.   Endocrine: Negative.   Genitourinary: Negative.   Musculoskeletal: Negative.   Skin: Negative.    Allergic/Immunologic: Negative.   Neurological: Negative.   Hematological: Negative.   Psychiatric/Behavioral: Negative.        Objective    BP 123/71 (BP Location: Right Arm, Patient Position: Sitting, Cuff Size: Normal)   Pulse 70   Temp 98 F (36.7 C) (Oral)   Wt 183 lb 8 oz (83.2 kg)   SpO2 100%   BMI 26.33 kg/m  BP Readings from Last 3 Encounters:  07/23/21 123/71  07/20/20 112/65  07/03/20 (!) 106/52   Wt Readings from Last 3 Encounters:  07/23/21 183 lb 8 oz (83.2 kg)  07/20/20 179 lb (81.2 kg)  07/03/20 182 lb (82.6 kg)      Physical Exam Vitals reviewed.  Constitutional:      Appearance: He is well-developed.  HENT:     Head: Normocephalic and atraumatic.     Right Ear: External ear normal.     Left Ear: External ear normal.     Nose: Nose normal.  Eyes:     Conjunctiva/sclera: Conjunctivae normal.     Pupils: Pupils are equal, round, and reactive to light.  Cardiovascular:     Rate and Rhythm: Normal rate and regular rhythm.     Heart sounds: Normal heart sounds.  Pulmonary:     Effort: Pulmonary effort is normal.     Breath sounds: Normal breath sounds.  Abdominal:     General: Bowel sounds are normal.     Palpations: Abdomen is soft.  Genitourinary:    Rectum: Guaiac result negative.  Musculoskeletal:        General: Normal range of motion.     Cervical back: Normal range of motion and neck supple.  Skin:    General: Skin is warm and dry.  Neurological:     Mental Status: He is alert and oriented to person, place, and time.  Psychiatric:        Mood and Affect: Mood normal.        Behavior: Behavior normal.        Thought Content: Thought content normal.        Judgment: Judgment normal.      Last depression screening scores PHQ 2/9 Scores 07/23/2021 07/20/2020 07/08/2019  PHQ - 2 Score 0 0 0  PHQ- 9 Score 0 0 -   Last fall risk screening Fall Risk  07/23/2021  Falls in the past year? 0  Number falls in past yr: 0  Injury with  Fall? 0  Risk for fall due to : No Fall Risks  Follow up -   Last Audit-C alcohol use screening Alcohol Use Disorder Test (AUDIT) 07/23/2021  1. How often do you have a drink containing  alcohol? 0  2. How many drinks containing alcohol do you have on a typical day when you are drinking? -  3. How often do you have six or more drinks on one occasion? -  AUDIT-C Score -  Alcohol Brief Interventions/Follow-up -   A score of 3 or more in women, and 4 or more in men indicates increased risk for alcohol abuse, EXCEPT if all of the points are from question 1   Results for orders placed or performed in visit on 07/23/21  HM COLONOSCOPY  Result Value Ref Range   HM Colonoscopy See Report (in chart) See Report (in chart), Patient Reported    Assessment & Plan    Routine Health Maintenance and Physical Exam  Exercise Activities and Dietary recommendations  Goals   None     Immunization History  Administered Date(s) Administered   Td 11/12/2003, 12/03/2011   Tdap 01/22/2020    Health Maintenance  Topic Date Due   HIV Screening  Never done   Zoster Vaccines- Shingrix (1 of 2) Never done   INFLUENZA VACCINE  Never done   TETANUS/TDAP  01/21/2030   COLONOSCOPY (Pts 45-41yrs Insurance coverage will need to be confirmed)  04/20/2031   Hepatitis C Screening  Completed   Pneumococcal Vaccine 76-63 Years old  Aged Out   HPV VACCINES  Aged Out    Discussed health benefits of physical activity, and encouraged him to engage in regular exercise appropriate for his age and condition.  1. Annual physical exam Follow-up 1 year. - Comprehensive metabolic panel - TSH - CBC w/Diff/Platelet  2. Benign prostatic hyperplasia without urinary obstruction  - PSA - Comprehensive metabolic panel - TSH  3. Screening for prostate cancer  - Comprehensive metabolic panel - TSH - CBC w/Diff/Platelet  4. Encounter for lipid screening for cardiovascular disease  - Comprehensive metabolic  panel - TSH - CBC w/Diff/Platelet  5. Urinary tract infection with hematuria, site unspecified  - sulfamethoxazole-trimethoprim (BACTRIM DS) 800-160 MG tablet; Take 1 tablet by mouth 2 (two) times daily.  Dispense: 20 tablet; Refill: 0 - CBC w/Diff/Platelet  6. Need for lipid screening  - Lipid panel - CBC w/Diff/Platelet  7. Urinary tract infection without hematuria, site unspecified Will treat with Septra for possible prostatitis although I think just pushing fluids and some cranberry juice will take care of this.  Urology referral if symptoms persist. - POCT urinalysis dipstick--Abnormal - CULTURE, URINE COMPREHENSIVE - CBC w/Diff/Platelet   Return in about 1 year (around 07/23/2022).     I, Wilhemena Durie, MD, have reviewed all documentation for this visit. The documentation on 07/23/21 for the exam, diagnosis, procedures, and orders are all accurate and complete.    Green Quincy Cranford Mon, MD  [facility] [phone] (phone) [phone] (fax)  [place]

## 2021-07-24 LAB — CBC WITH DIFFERENTIAL/PLATELET
Basophils Absolute: 0 10*3/uL (ref 0.0–0.2)
Basos: 1 %
EOS (ABSOLUTE): 0.2 10*3/uL (ref 0.0–0.4)
Eos: 3 %
Hematocrit: 47 % (ref 37.5–51.0)
Hemoglobin: 15.7 g/dL (ref 13.0–17.7)
Immature Grans (Abs): 0 10*3/uL (ref 0.0–0.1)
Immature Granulocytes: 1 %
Lymphocytes Absolute: 1.7 10*3/uL (ref 0.7–3.1)
Lymphs: 28 %
MCH: 28.3 pg (ref 26.6–33.0)
MCHC: 33.4 g/dL (ref 31.5–35.7)
MCV: 85 fL (ref 79–97)
Monocytes Absolute: 0.5 10*3/uL (ref 0.1–0.9)
Monocytes: 9 %
Neutrophils Absolute: 3.6 10*3/uL (ref 1.4–7.0)
Neutrophils: 58 %
Platelets: 218 10*3/uL (ref 150–450)
RBC: 5.54 x10E6/uL (ref 4.14–5.80)
RDW: 13.6 % (ref 11.6–15.4)
WBC: 6 10*3/uL (ref 3.4–10.8)

## 2021-07-24 LAB — COMPREHENSIVE METABOLIC PANEL
ALT: 21 IU/L (ref 0–44)
AST: 22 IU/L (ref 0–40)
Albumin/Globulin Ratio: 2.8 — ABNORMAL HIGH (ref 1.2–2.2)
Albumin: 4.8 g/dL (ref 3.8–4.8)
Alkaline Phosphatase: 49 IU/L (ref 44–121)
BUN/Creatinine Ratio: 17 (ref 10–24)
BUN: 19 mg/dL (ref 8–27)
Bilirubin Total: 0.7 mg/dL (ref 0.0–1.2)
CO2: 26 mmol/L (ref 20–29)
Calcium: 9.6 mg/dL (ref 8.6–10.2)
Chloride: 100 mmol/L (ref 96–106)
Creatinine, Ser: 1.15 mg/dL (ref 0.76–1.27)
Globulin, Total: 1.7 g/dL (ref 1.5–4.5)
Glucose: 100 mg/dL — ABNORMAL HIGH (ref 70–99)
Potassium: 4.7 mmol/L (ref 3.5–5.2)
Sodium: 138 mmol/L (ref 134–144)
Total Protein: 6.5 g/dL (ref 6.0–8.5)
eGFR: 72 mL/min/{1.73_m2} (ref >59)

## 2021-07-24 LAB — LIPID PANEL
Chol/HDL Ratio: 4.1 ratio (ref 0.0–5.0)
Cholesterol, Total: 243 mg/dL — ABNORMAL HIGH (ref 100–199)
HDL: 60 mg/dL (ref >39)
LDL Chol Calc (NIH): 165 mg/dL — ABNORMAL HIGH (ref 0–99)
Triglycerides: 101 mg/dL (ref 0–149)
VLDL Cholesterol Cal: 18 mg/dL (ref 5–40)

## 2021-07-24 LAB — PSA: Prostate Specific Ag, Serum: 1.6 ng/mL (ref 0.0–4.0)

## 2021-07-24 LAB — TSH: TSH: 4.28 u[IU]/mL (ref 0.450–4.500)

## 2021-07-30 LAB — CULTURE, URINE COMPREHENSIVE

## 2021-07-31 ENCOUNTER — Telehealth: Payer: Self-pay

## 2021-07-31 NOTE — Telephone Encounter (Signed)
Copied from [place] [id number]. Topic: General - Inquiry >> Jul 31, 2021  1:02 PM Greggory Keen D wrote: Reason for CRM: Pt called saying he got a call from the office but no one left a message,  It probably was his lab results.

## 2021-08-01 ENCOUNTER — Telehealth: Payer: Self-pay

## 2021-08-01 NOTE — Telephone Encounter (Signed)
Pt given results per notes of [name] on 07/25/21. Pt verbalized understanding.

## 2021-08-01 NOTE — Telephone Encounter (Signed)
See lab results note.

## 2021-11-02 ENCOUNTER — Ambulatory Visit (INDEPENDENT_AMBULATORY_CARE_PROVIDER_SITE_OTHER): Payer: Managed Care, Other (non HMO) | Admitting: Physician Assistant

## 2021-11-02 ENCOUNTER — Encounter: Payer: Self-pay | Admitting: Physician Assistant

## 2021-11-02 ENCOUNTER — Other Ambulatory Visit: Payer: Self-pay

## 2021-11-02 VITALS — BP 116/65 | HR 59 | Temp 97.7°F | Ht 70.0 in | Wt 184.1 lb

## 2021-11-02 DIAGNOSIS — L989 Disorder of the skin and subcutaneous tissue, unspecified: Secondary | ICD-10-CM | POA: Diagnosis not present

## 2021-11-02 DIAGNOSIS — R300 Dysuria: Secondary | ICD-10-CM

## 2021-11-02 LAB — POCT URINALYSIS DIPSTICK
Bilirubin, UA: NEGATIVE
Blood, UA: NEGATIVE
Glucose, UA: NEGATIVE
Ketones, UA: NEGATIVE
Nitrite, UA: NEGATIVE
Protein, UA: NEGATIVE
Spec Grav, UA: 1.015 (ref 1.010–1.025)
Urobilinogen, UA: 1 E.U./dL
pH, UA: 6 (ref 5.0–8.0)

## 2021-11-02 NOTE — Progress Notes (Signed)
?  ? ? ?Established patient visit ? ? ?Patient: Christopher Shannon   DOB: [birth year]   61 y.o. Male  MRN: [id number] ?Visit Date: 11/02/2021 ? ?Today's healthcare provider: Mardene Speak, PA-C  ? ?Chief Complaint  ?Patient presents with  ? Urinary Tract Infection  ? ?Subjective  ?  ?Urinary Tract Infection  ?This is a new problem. The current episode started more than 1 month ago. The problem occurs intermittently. The problem has been unchanged. The quality of the pain is described as burning (slight burning/difficulty starting flow / strong odor). The pain is mild. There has been no fever. He is Sexually active. Associated symptoms include frequency. Pertinent negatives include no chills, discharge, flank pain, hematuria, hesitancy, nausea, sweats, urgency or vomiting. He has tried nothing for the symptoms. His past medical history is significant for recurrent UTIs. There is no history of catheterization, kidney stones, a single kidney, urinary stasis or a urological procedure.   ?URINARY SYMPTOMS ?Urinary frequency: yes ; Patient states that he has been having urinary frequency since he had prostatitis when he was in his 67s.  He was diagnosed with BPH wo urinary obstruction in 2008. ?Urgency: no ?Small volume voids: yes ?Symptom severity: yes ?Urinary incontinence: yes but rare ?Foul odor: yes ?Hematuria: no ?Abdominal pain: "not urinary pain" ?Back pain: yes due to "arthritic pain" ?Suprapubic pain/pressure: yes ?Flank pain: no ?Fever:  no ?Vomiting: no ?Relief with cranberry juice: yes ?Status: getting worse ?Previous urinary tract infection: at lest 4 for the past 15 months: "15 months ago- the worst one , last one was on 07/2021  and finally, this is his fourth infection." ?Per patient records, patient has E.coli UTI on 07/23/21 was treated with Bactrim, and E.coli UTI on 07/03/20 was treated successfully with Bactrim. ?Recurrent urinary tract infection: yes ?Sexual activity: monogamous/practicing safe  sex ?History of sexually transmitted disease: none ?Penile discharge: no ?Treatments attempted: antibiotics, cranberry, juice and increasing fluids  ? ?Medications: ?Outpatient Medications Prior to Visit  ?Medication Sig  ? ibuprofen (ADVIL) 200 MG tablet Take 3 tablets (600 mg total) by mouth every 6 (six) hours as needed for headache, mild pain or moderate pain.  ? sulfamethoxazole-trimethoprim (BACTRIM DS) 800-160 MG tablet Take 1 tablet by mouth 2 (two) times daily.  ? ?No facility-administered medications prior to visit.  ? ? ?Review of Systems  ?Constitutional:  Negative for chills.  ?Gastrointestinal:  Negative for nausea and vomiting.  ?Genitourinary:  Positive for frequency. Negative for flank pain, hematuria, hesitancy and urgency.  ? ? ?  Objective  ?  ?BP 116/65 (BP Location: Right Arm, Patient Position: Sitting, Cuff Size: Normal)   Pulse (!) 59   Temp 97.7 ?F (36.5 ?C)   Ht 5\' 10"  (1.778 m)   Wt 184 lb 1.6 oz (83.5 kg)   SpO2 100%   BMI 26.42 kg/m?  ? ? ?Physical Exam ?Constitutional:   ?   Appearance: Normal appearance.  ?HENT:  ?   Head: Normocephalic and atraumatic.  ?Cardiovascular:  ?   Rate and Rhythm: Normal rate and regular rhythm.  ?   Pulses: Normal pulses.  ?   Heart sounds: Normal heart sounds.  ?Pulmonary:  ?   Breath sounds: Normal breath sounds.  ?Abdominal:  ?   General: Abdomen is flat. Bowel sounds are normal. There is no distension.  ?   Palpations: Abdomen is soft.  ?   Tenderness: There is no abdominal tenderness. There is no right CVA tenderness or left CVA tenderness.  ?  Skin: ?   Findings: No lesion (multiple facial lesion: pigmented, apigmented).  ?Neurological:  ?   Mental Status: He is alert.  ?Psychiatric:     ?   Mood and Affect: Mood normal.     ?   Behavior: Behavior normal.     ?   Thought Content: Thought content normal.     ?   Judgment: Judgment normal.  ?  ? ? ?Results for orders placed or performed in visit on 11/02/21  ?POCT Urinalysis Dipstick  ?Result Value  Ref Range  ? Color, UA yellow   ? Clarity, UA clear   ? Glucose, UA Negative Negative  ? Bilirubin, UA neg   ? Ketones, UA neg   ? Spec Grav, UA 1.015 1.010 - 1.025  ? Blood, UA neg   ? pH, UA 6.0 5.0 - 8.0  ? Protein, UA Negative Negative  ? Urobilinogen, UA 1.0 0.2 or 1.0 E.U./dL  ? Nitrite, UA neg   ? Leukocytes, UA Trace (A) Negative  ? Appearance    ? Odor    ? ? Assessment & Plan  ?  ? ?1. Dysuria ?Due to recurrent ?/persistent UTI vs chronic prostatitis ?2 culture-proven UTIs within a 15 mo period. Hx of BPH (2008) ?Last PSA on 07/23/21 was 1.6. Has no smoking hx ?- POCT Urinalysis Dipstick: leukocytes - trace, no blood. ?- Urine culture ordered. We hold antibiotic until the culture results ?- Continue to drink cranberry juice, drink a lot of water/fluids. ?- Might need Urology referral for recurrent UTI if it is culture-proven UTI. ? Might need to consider continues prophylaxis with antibiotics if? ? ? ?2. Multiple lesions of face ?Nevus vs BCC vs SKs ?- Ambulatory referral to Dermatology  ? ?I discussed the assessment and treatment plan with the patient  The patient was provided an opportunity to ask questions and all were answered. The patient agreed with the plan and demonstrated an understanding of the instructions. ?  ?The patient was advised to call back or seek an in-person evaluation if the symptoms worsen or if the condition fails to improve as anticipated.  ? ?Mardene Speak, PA-C  ?[place] ?[phone] (phone) ?[phone] (fax) ? ?[facility]  ?

## 2021-11-09 ENCOUNTER — Telehealth: Payer: Self-pay

## 2021-11-09 NOTE — Telephone Encounter (Signed)
Lmtcb no labs order, in office POCT U/A done. According to your office note Christopher Shannon on 11/02/21 its states that Urine Cuture was ordered, but there is no order in chart. KW ?

## 2021-11-09 NOTE — Telephone Encounter (Signed)
Pt stated he is still waiting for urine culture results, pt is confused why he hasn't had his urine results back yet, pt wanted a call back.  ?

## 2021-11-09 NOTE — Telephone Encounter (Signed)
Copied from [place] [phone]. Topic: General - Other ?>> Nov 08, 2021  4:04 PM Pawlus, Brayton Layman A wrote: ?Reason for CRM: Pt called in to go over his latest lab results, please advise. ?

## 2021-11-10 ENCOUNTER — Encounter: Payer: Self-pay | Admitting: Physician Assistant

## 2021-11-12 ENCOUNTER — Other Ambulatory Visit: Payer: Self-pay | Admitting: Physician Assistant

## 2021-11-12 ENCOUNTER — Other Ambulatory Visit: Payer: Self-pay

## 2021-11-12 DIAGNOSIS — R300 Dysuria: Secondary | ICD-10-CM

## 2021-11-12 NOTE — Telephone Encounter (Signed)
Left message to call back, from what I am seeing patient did not have culture collected at visit. Patient will need to come by office and drop off urine sample okay for [facility] nurse triage to advise. KW ?

## 2021-11-12 NOTE — Telephone Encounter (Signed)
Pt returned call.   I gave him the message to come by and give a urine sample today.   He was agreeable to this plan.   He will be in between 2:0-3:00 today. ?

## 2021-11-13 NOTE — Telephone Encounter (Signed)
Speciemen was collected by CMA on 11/12/21 and sent for to lab. KW ?

## 2021-11-15 LAB — URINE CULTURE

## 2021-11-16 ENCOUNTER — Ambulatory Visit: Payer: Self-pay | Admitting: *Deleted

## 2021-11-16 ENCOUNTER — Other Ambulatory Visit: Payer: Self-pay | Admitting: Physician Assistant

## 2021-11-16 DIAGNOSIS — N390 Urinary tract infection, site not specified: Secondary | ICD-10-CM

## 2021-11-16 MED ORDER — SULFAMETHOXAZOLE-TRIMETHOPRIM 800-160 MG PO TABS: 1.0000 | ORAL_TABLET | Freq: Two times a day (BID) | ORAL | 0 refills | Status: DC

## 2021-11-16 NOTE — Progress Notes (Unsigned)
Communicated with patient over the phone: ?Discussed the results of Urine Culture. Prescribed Bactrim BID for 10 days.  ?Placed referral to Urology for recurrent UTIs ( two episodes of acute bacterial cystitis, along with associated symptoms within the last six months/ 2 documented UTI with confirmed Urine culture) ?Patient was advised on possible side effects. He expressed his understanding and ?agreed with treatment plan ?

## 2021-11-16 NOTE — Telephone Encounter (Signed)
Requesting results form 11/12/21. Patient reviewed my Chart 11/16/21 at 1:19 pm. Please advise with provider instructions. Patient would like a call back.  ?

## 2021-12-05 ENCOUNTER — Ambulatory Visit (INDEPENDENT_AMBULATORY_CARE_PROVIDER_SITE_OTHER): Payer: Managed Care, Other (non HMO) | Admitting: Urology

## 2021-12-05 ENCOUNTER — Encounter: Payer: Self-pay | Admitting: Urology

## 2021-12-05 VITALS — BP 136/83 | HR 69 | Ht 70.0 in | Wt 178.0 lb

## 2021-12-05 DIAGNOSIS — R3912 Poor urinary stream: Secondary | ICD-10-CM

## 2021-12-05 DIAGNOSIS — N390 Urinary tract infection, site not specified: Secondary | ICD-10-CM

## 2021-12-05 DIAGNOSIS — N401 Enlarged prostate with lower urinary tract symptoms: Secondary | ICD-10-CM

## 2021-12-05 LAB — MICROSCOPIC EXAMINATION
Bacteria, UA: NONE SEEN
Epithelial Cells (non renal): NONE SEEN /hpf (ref 0–10)

## 2021-12-05 LAB — BLADDER SCAN AMB NON-IMAGING: Scan Result: 55

## 2021-12-05 LAB — URINALYSIS, COMPLETE
Bilirubin, UA: NEGATIVE
Glucose, UA: NEGATIVE
Ketones, UA: NEGATIVE
Leukocytes,UA: NEGATIVE
Nitrite, UA: NEGATIVE
Protein,UA: NEGATIVE
RBC, UA: NEGATIVE
Specific Gravity, UA: 1.025 (ref 1.005–1.030)
Urobilinogen, Ur: 1 mg/dL (ref 0.2–1.0)
pH, UA: 6 (ref 5.0–7.5)

## 2021-12-05 MED ORDER — SULFAMETHOXAZOLE-TRIMETHOPRIM 800-160 MG PO TABS: 1.0000 | ORAL_TABLET | Freq: Two times a day (BID) | ORAL | 0 refills | Status: AC

## 2021-12-05 NOTE — Progress Notes (Signed)
? ?12/05/2021 ?1:04 PM  ? ?[name] Erichsen ?Jun 27, 1961 ?[id number] ? ?Referring provider: Mardene Speak, PA-C ?[id number] ?[place],  [place] [address] ? ?Chief Complaint  ?Patient presents with  ? Recurrent UTI  ? ? ?HPI: ?Christopher Shannon is a 61 y.o. male referred for recurrent UTI. ? ?Culture positive symptomatic UTIs November 2021 (E. coli), November 2022 (E. coli) and 10/2021 (E. coli) ?Primary symptoms are frequency, urgency and dysuria ?No fever, chills, gross hematuria or flank/abdominal/pelvic pain; states initial infection was more severe with pain and thinks he had fever.  Subsequent symptoms have been mild ?Longest antibiotic course 7-10 days ?Last infection had negative UA and positive culture ?PSAs have been low and stable ?IPSS today 11/35 with weak urinary stream his most bothersome symptom ? ? ?PMH: ?Past Medical History:  ?Diagnosis Date  ? Anxiety 10/22/2017  ? Arm numbness 10/22/2017  ? Arthritis   ? Benign prostatic hyperplasia without urinary obstruction 10/24/2006  ? Fatigue 10/22/2017  ? GERD 07/06/2010  ? Qualifier: Diagnosis of  By: Loanne Drilling MD, Jacelyn Pi   ? GERD (gastroesophageal reflux disease)   ? NO MEDS  ? Hiatal hernia   ? Irritable bowel syndrome with constipation 01/04/2015  ? Worse after intentional wt loss.   ? Joint pain   ? Umbilical hernia without obstruction and without gangrene 04/15/2017  ? ? ?Surgical History: ?Past Surgical History:  ?Procedure Laterality Date  ? COLONOSCOPY  2011  ? INGUINAL HERNIA REPAIR Left 04/05/2020  ? Procedure: HERNIA REPAIR INGUINAL ADULT, open;  Surgeon: [name], MD;  Location: [facility] ORS;  Service: General;  Laterality: Left;  ? TONSILLECTOMY    ? AGE 75  ? UMBILICAL HERNIA REPAIR N/A 05/20/2018  ? Procedure: HERNIA REPAIR UMBILICAL ADULT;  Surgeon: Florene Glen, MD;  Location: [facility] ORS;  Service: General;  Laterality: N/A;  ? UPPER GI ENDOSCOPY    ? ? ?Home Medications:  ?Allergies as of 12/05/2021   ?No Known Allergies ?  ? ?   ?Medication List  ?  ? ?  ? Accurate as of December 05, 2021  1:04 PM. If you have any questions, ask your nurse or doctor.  ?  ?  ? ?  ? ?sulfamethoxazole-trimethoprim 800-160 MG tablet ?Commonly known as: BACTRIM DS ?Take 1 tablet by mouth 2 (two) times daily. ?  ?tobramycin-dexamethasone ophthalmic solution ?Commonly known as: TOBRADEX ?1 drop 2 (two) times daily. ?  ? ?  ? ? ?Allergies: No Known Allergies ? ?Family History: ?Family History  ?Problem Relation Age of Onset  ? Lupus Mother   ? Stomach cancer Mother   ? Thyroid disease Mother   ? Heart disease Father   ? Asthma Son   ? ? ?Social History:  reports that he has never smoked. He has never used smokeless tobacco. He reports that he does not drink alcohol and does not use drugs. ? ? ?Physical Exam: ?BP 136/83   Pulse 69   Ht '5\' 10"'$  (1.778 m)   Wt 178 lb (80.7 kg)   BMI 25.54 kg/m?   ?Constitutional:  Alert and oriented, No acute distress. ?HEENT: [place] AT, moist mucus membranes.  Trachea midline, no masses. ?Respiratory: Normal respiratory effort, no increased work of breathing. ?GU: Prostate 45 g, smooth without nodules ?Skin: No rashes, bruises or suspicious lesions. ?Neurologic: Grossly intact, no focal deficits, moving all 4 extremities. ?Psychiatric: Normal mood and affect. ? ?Laboratory Data: ? ?Urinalysis ?Dipstick/microscopy negative ? ? ?Assessment & Plan:   ? ?1.  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 ?2.  BPH with LUTS ?Moderate lower urinary tract symptoms which are not bothersome enough that he desires medical management ? ? ?Abbie Sons, MD ? ?[facility] ?[address], [address] ?[place], [place] [address] ?[phone] ?

## 2021-12-08 LAB — CULTURE, URINE COMPREHENSIVE

## 2021-12-22 IMAGING — US US SCROTUM W/ DOPPLER COMPLETE
1 series · 14 of 25 positions shown · non-contrast
Comparison: None.

CLINICAL DATA: Left-sided scrotal mass.

EXAM:
SCROTAL ULTRASOUND
DOPPLER ULTRASOUND OF THE TESTICLES
TECHNIQUE: Complete ultrasound examination of the testicles, epididymis, and
other scrotal structures was performed. Color and spectral Doppler
ultrasound were also utilized to evaluate blood flow to the
testicles.

[Series 1: us scrotum w/ doppler complete · 0.09mm/px · 63 acquisitions, 14 frames shown]
[im 1/63]
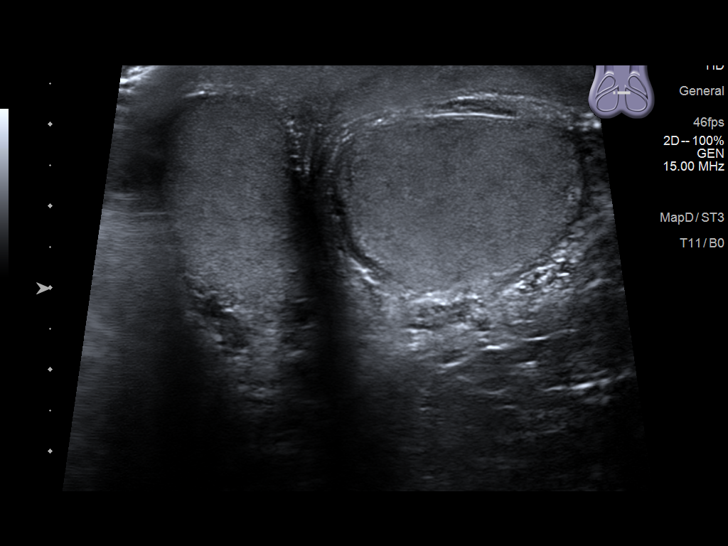
[im 6/63]
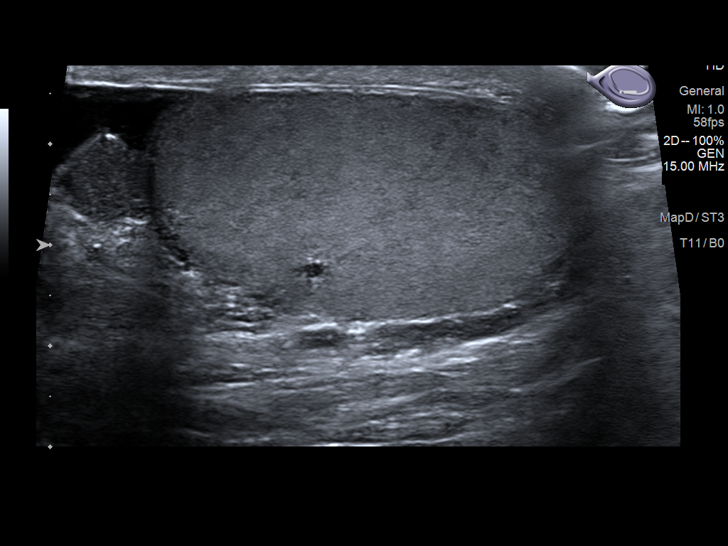
[im 11/63]
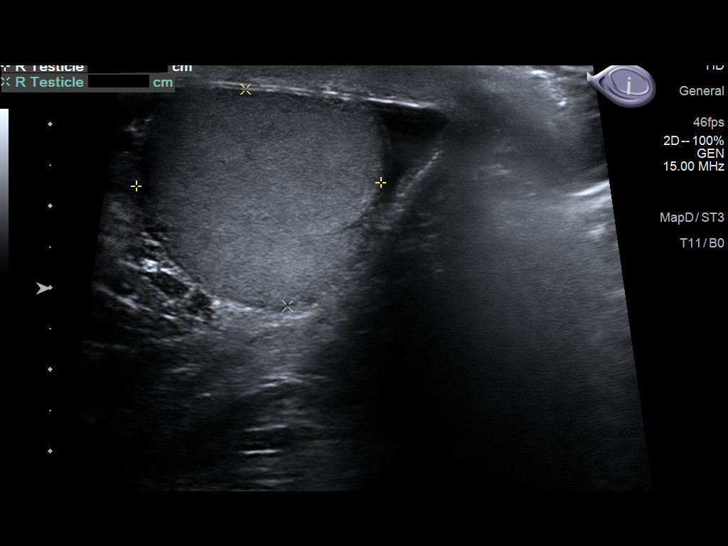
[im 16/63]
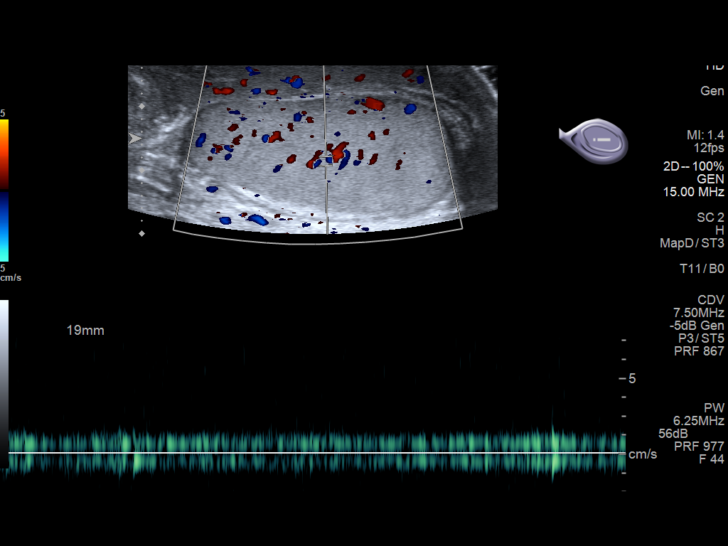
[im 21/63]
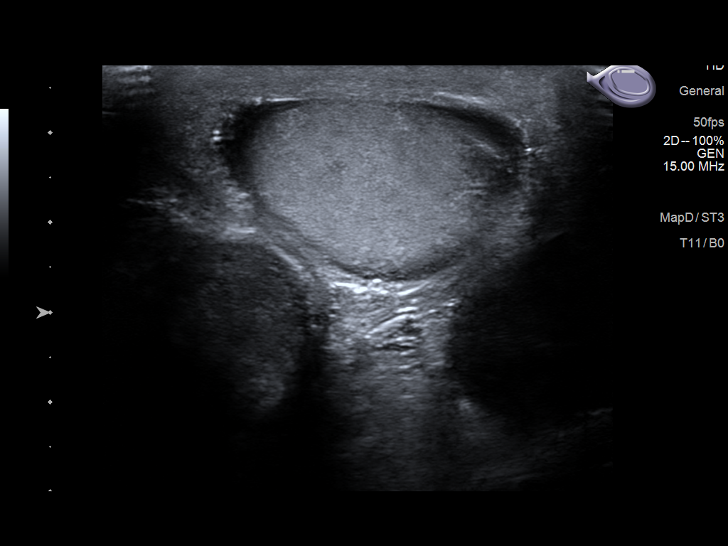
[im 24/63]
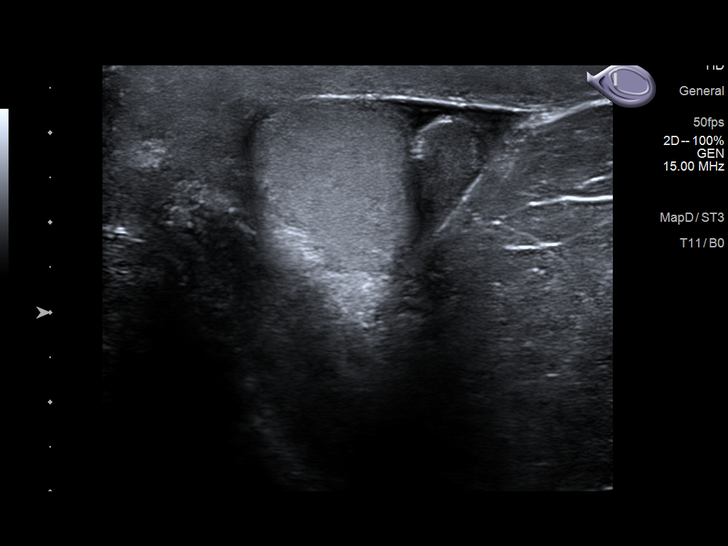
[im 29/63]
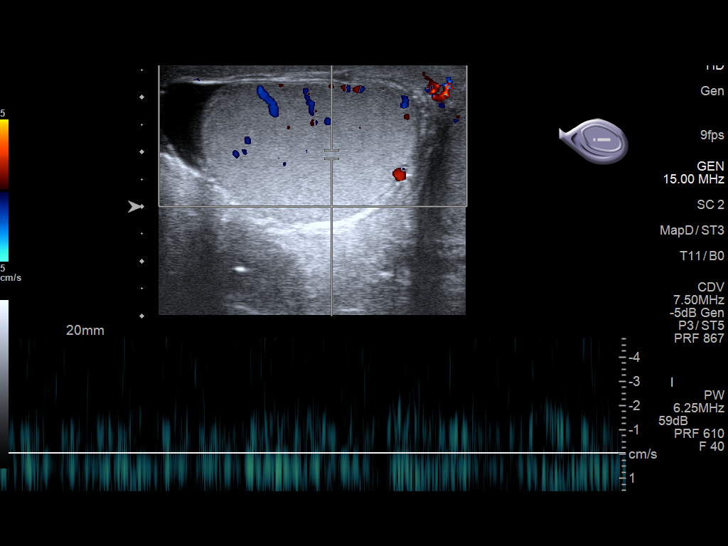
[im 34/63]
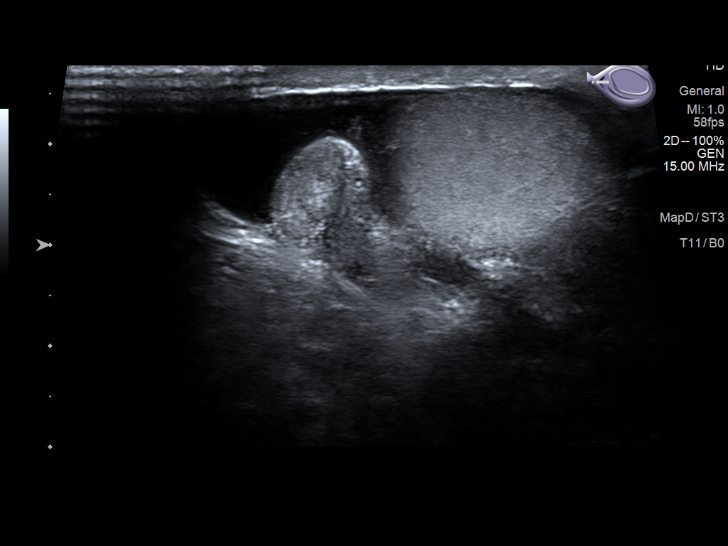
[im 39/63]
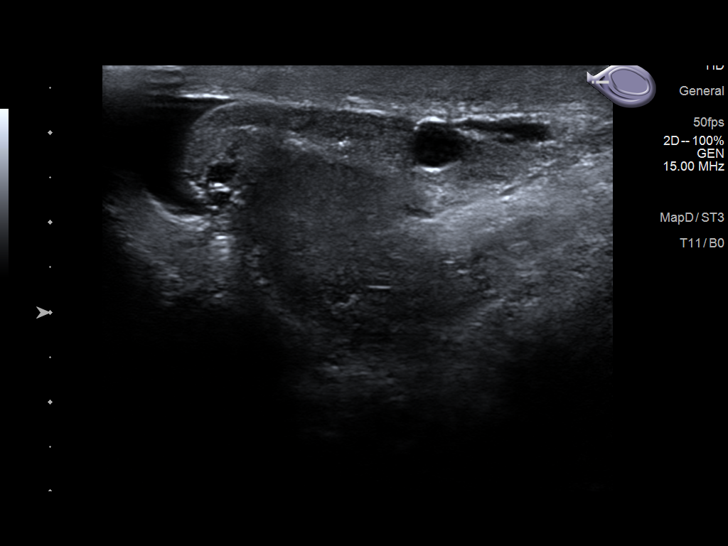
[im 42/63]
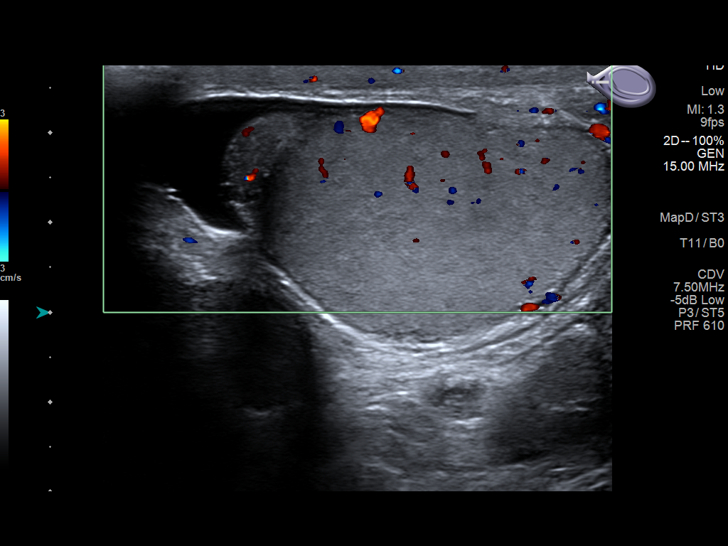
[im 47/63]
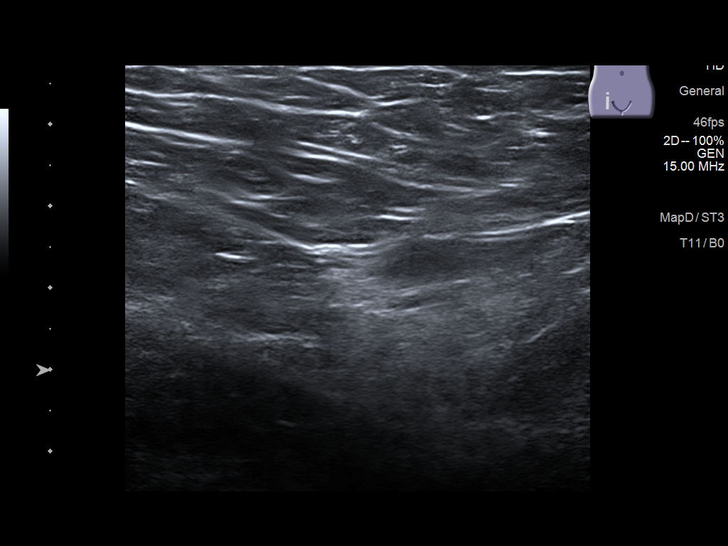
[im 52/63]
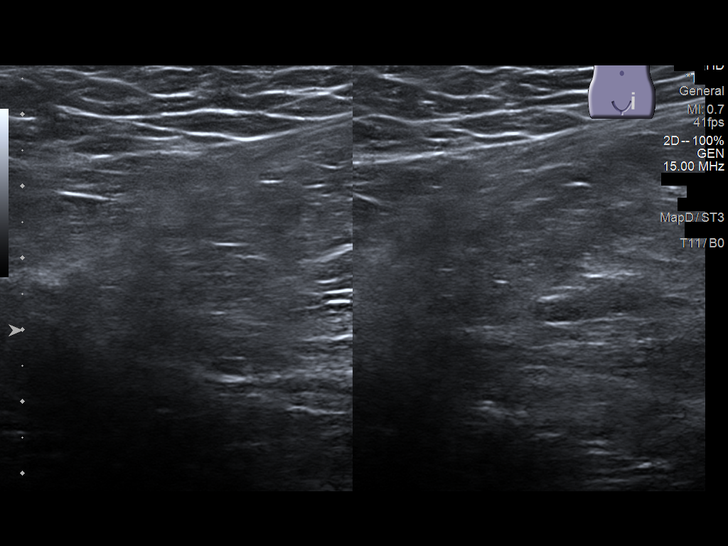
[im 57/63]
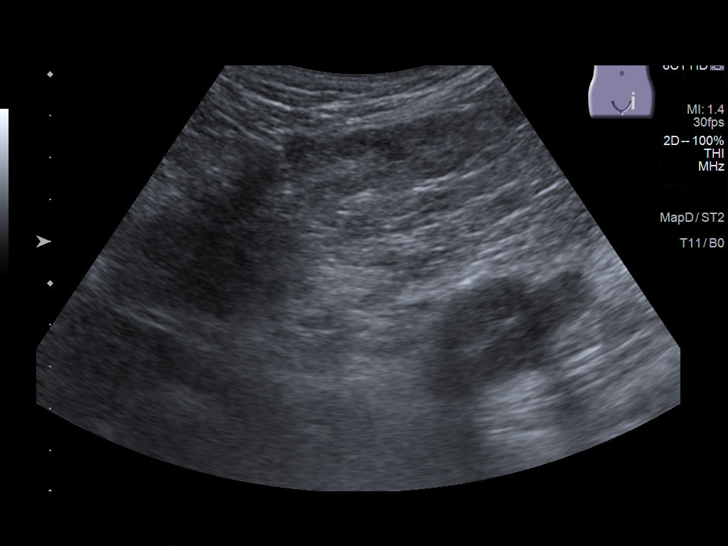
[im 63/63]
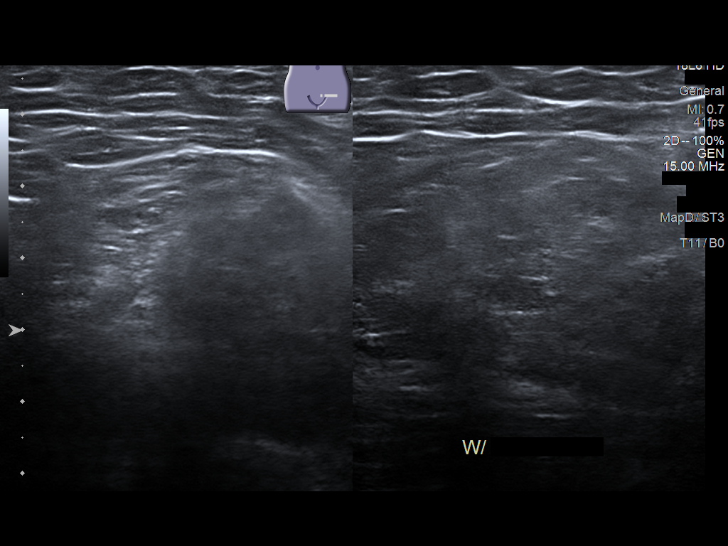

[14 of 25 positions shown; findings below may reference images not displayed]

FINDINGS: Right testicle

Measurements: 4.6 x 3.0 x 2.7 cm. No mass or microlithiasis
visualized.

Left testicle

Measurements: 4.2 x 2.8 x 2.5 cm. No mass or microlithiasis
visualized.

Right epididymis:  Normal in size and appearance.

Left epididymis:  6 mm cyst is noted.

Hydrocele:  None visualized.

Varicocele:  None visualized.

Pulsed Doppler interrogation of both testes demonstrates normal low
resistance arterial and venous waveforms bilaterally.

There appears to be a large fat containing left inguinal hernia
which extends into the scrotum.
IMPRESSION: No evidence of testicular mass or torsion is noted. Large fat
containing left inguinal hernia is noted which extends into the
scrotum. CT scan may be performed for further evaluation.

## 2021-12-24 ENCOUNTER — Ambulatory Visit
Admission: RE | Admit: 2021-12-24 | Discharge: 2021-12-24 | Disposition: A | Discharge status: Home / Self Care | Payer: Managed Care, Other (non HMO) | Source: Ambulatory Visit | Attending: Urology | Admitting: Urology

## 2021-12-24 DIAGNOSIS — N390 Urinary tract infection, site not specified: Secondary | ICD-10-CM | POA: Insufficient documentation

## 2021-12-31 ENCOUNTER — Telehealth: Payer: Self-pay | Admitting: *Deleted

## 2021-12-31 NOTE — Telephone Encounter (Signed)
Pt calling asking for results of the Renal US, please advise ?

## 2022-01-01 ENCOUNTER — Encounter: Payer: Self-pay | Admitting: *Deleted

## 2022-01-01 NOTE — Telephone Encounter (Signed)
Renal ultrasound showed no significant abnormalities.  Small, bilateral renal cysts and possible small renal calculi.  None of these findings would predispose to recurrent urinary tract infections.  Recommend 46-monthfollow-up appointment with KUB.  Call earlier for recurrent UTI symptoms ?

## 2022-04-03 ENCOUNTER — Other Ambulatory Visit: Payer: Self-pay | Admitting: *Deleted

## 2022-04-03 DIAGNOSIS — N200 Calculus of kidney: Secondary | ICD-10-CM

## 2022-04-04 ENCOUNTER — Ambulatory Visit (INDEPENDENT_AMBULATORY_CARE_PROVIDER_SITE_OTHER): Payer: Managed Care, Other (non HMO) | Admitting: Urology

## 2022-04-04 ENCOUNTER — Encounter: Payer: Self-pay | Admitting: Urology

## 2022-04-04 VITALS — BP 129/79 | HR 58 | Ht 70.0 in | Wt 180.0 lb

## 2022-04-04 DIAGNOSIS — N401 Enlarged prostate with lower urinary tract symptoms: Secondary | ICD-10-CM | POA: Diagnosis not present

## 2022-04-04 DIAGNOSIS — Z87440 Personal history of urinary (tract) infections: Secondary | ICD-10-CM

## 2022-04-04 DIAGNOSIS — N390 Urinary tract infection, site not specified: Secondary | ICD-10-CM

## 2022-04-04 LAB — URINALYSIS, COMPLETE
Bilirubin, UA: NEGATIVE
Glucose, UA: NEGATIVE
Ketones, UA: NEGATIVE
Nitrite, UA: NEGATIVE
Protein,UA: NEGATIVE
RBC, UA: NEGATIVE
Specific Gravity, UA: 1.01 (ref 1.005–1.030)
Urobilinogen, Ur: 0.2 mg/dL (ref 0.2–1.0)
pH, UA: 6 (ref 5.0–7.5)

## 2022-04-04 LAB — MICROSCOPIC EXAMINATION: Bacteria, UA: NONE SEEN

## 2022-04-04 MED ORDER — TAMSULOSIN HCL 0.4 MG PO CAPS: 0.4000 mg | ORAL_CAPSULE | Freq: Every day | ORAL | 1 refills | Status: DC

## 2022-04-04 NOTE — Progress Notes (Signed)
04/04/2022 2:29 PM   Christopher Shannon 1961-01-20 [id number]  Referring provider: Jerrol Banana., MD [address] [place],  [place] [address]  Chief Complaint  Patient presents with   Nephrolithiasis    HPI: 61 y.o. male presents for 3 month follow-up.  Refer to my previous note 12/05/2021 He has had no recurrent infections since his last visit Does have bothersome frequency and a weak urinary stream.  Notes occasional urgency, postvoid dribbling and urinary hesitancy.  Has previously declined an alpha-blocker trial.  IPSS last visit 11/35 PSA 11/20221.6   PMH: Past Medical History:  Diagnosis Date   Anxiety 10/22/2017   Arm numbness 10/22/2017   Arthritis    Benign prostatic hyperplasia without urinary obstruction 10/24/2006   Fatigue 10/22/2017   GERD 07/06/2010   Qualifier: Diagnosis of  By: Loanne Drilling MD, Hilliard Clark A    GERD (gastroesophageal reflux disease)    NO MEDS   Hiatal hernia    Irritable bowel syndrome with constipation 01/04/2015   Worse after intentional wt loss.    Joint pain    Umbilical hernia without obstruction and without gangrene 04/15/2017    Surgical History: Past Surgical History:  Procedure Laterality Date   COLONOSCOPY  2011   INGUINAL HERNIA REPAIR Left 04/05/2020   Procedure: HERNIA REPAIR INGUINAL ADULT, open;  Surgeon: [name], MD;  Location: [facility] ORS;  Service: General;  Laterality: Left;   TONSILLECTOMY     AGE 76   UMBILICAL HERNIA REPAIR N/A 05/20/2018   Procedure: HERNIA REPAIR UMBILICAL ADULT;  Surgeon: Florene Glen, MD;  Location: [facility] ORS;  Service: General;  Laterality: N/A;   UPPER GI ENDOSCOPY      Home Medications:  Allergies as of 04/04/2022   No Known Allergies      Medication List        Accurate as of April 04, 2022  2:29 PM. If you have any questions, ask your nurse or doctor.          tobramycin-dexamethasone ophthalmic solution Commonly known as: TOBRADEX 1 drop 2 (two) times  daily.        Allergies: No Known Allergies  Family History: Family History  Problem Relation Age of Onset   Lupus Mother    Stomach cancer Mother    Thyroid disease Mother    Heart disease Father    Asthma Son     Social History:  reports that he has never smoked. He has never used smokeless tobacco. He reports that he does not drink alcohol and does not use drugs.   Physical Exam: BP 129/79   Pulse (!) 58   Ht '5\' 10"'$  (1.778 m)   Wt 180 lb (81.6 kg)   BMI 25.83 kg/m   Constitutional:  Alert, No acute distress. HEENT: [place] AT Respiratory: Normal respiratory effort, no increased work of breathing. Psychiatric: Normal mood and affect.  Laboratory Data:  Urinalysis Dipstick/microscopy negative   Assessment & Plan:    1.  BPH with LUTS Moderate lower urinary tract symptoms He was interested in a 30-day alpha-blocker trial and Rx tamsulosin was sent to pharmacy.  Potential side effects were discussed including orthostatic symptoms and ejaculatory dysfunction If effective and he desires to continue he will call back for an Rx  2.  Recurrent UTI No recurrent infections UA today clear Follow-up 1 year with UA   Abbie Sons, MD  [place] [address], [place], [place] [address] [phone]

## 2022-04-24 ENCOUNTER — Ambulatory Visit: Payer: Managed Care, Other (non HMO) | Admitting: Dermatology

## 2022-06-10 ENCOUNTER — Ambulatory Visit (INDEPENDENT_AMBULATORY_CARE_PROVIDER_SITE_OTHER): Payer: Managed Care, Other (non HMO) | Admitting: Dermatology

## 2022-06-10 ENCOUNTER — Encounter: Payer: Self-pay | Admitting: Dermatology

## 2022-06-10 DIAGNOSIS — L814 Other melanin hyperpigmentation: Secondary | ICD-10-CM

## 2022-06-10 DIAGNOSIS — L578 Other skin changes due to chronic exposure to nonionizing radiation: Secondary | ICD-10-CM

## 2022-06-10 DIAGNOSIS — Z1283 Encounter for screening for malignant neoplasm of skin: Secondary | ICD-10-CM

## 2022-06-10 DIAGNOSIS — C4492 Squamous cell carcinoma of skin, unspecified: Secondary | ICD-10-CM

## 2022-06-10 DIAGNOSIS — D2339 Other benign neoplasm of skin of other parts of face: Secondary | ICD-10-CM | POA: Diagnosis not present

## 2022-06-10 DIAGNOSIS — D1801 Hemangioma of skin and subcutaneous tissue: Secondary | ICD-10-CM

## 2022-06-10 DIAGNOSIS — D0439 Carcinoma in situ of skin of other parts of face: Secondary | ICD-10-CM | POA: Diagnosis not present

## 2022-06-10 DIAGNOSIS — D239 Other benign neoplasm of skin, unspecified: Secondary | ICD-10-CM

## 2022-06-10 DIAGNOSIS — Z86007 Personal history of in-situ neoplasm of skin: Secondary | ICD-10-CM

## 2022-06-10 DIAGNOSIS — L720 Epidermal cyst: Secondary | ICD-10-CM

## 2022-06-10 DIAGNOSIS — D492 Neoplasm of unspecified behavior of bone, soft tissue, and skin: Secondary | ICD-10-CM

## 2022-06-10 DIAGNOSIS — D229 Melanocytic nevi, unspecified: Secondary | ICD-10-CM

## 2022-06-10 DIAGNOSIS — L821 Other seborrheic keratosis: Secondary | ICD-10-CM

## 2022-06-10 HISTORY — DX: Personal history of in-situ neoplasm of skin: Z86.007

## 2022-06-10 HISTORY — DX: Squamous cell carcinoma of skin, unspecified: C44.92

## 2022-06-10 NOTE — Patient Instructions (Addendum)
Wound Care Instructions  Cleanse wound gently with soap and water once a day then pat dry with clean gauze. Apply a thin coat of Petrolatum (petroleum jelly, "Vaseline") over the wound (unless you have an allergy to this). We recommend that you use a new, sterile tube of Vaseline. Do not pick or remove scabs. Do not remove the yellow or white "healing tissue" from the base of the wound.  Cover the wound with fresh, clean, nonstick gauze and secure with paper tape. You may use Band-Aids in place of gauze and tape if the wound is small enough, but would recommend trimming much of the tape off as there is often too much. Sometimes Band-Aids can irritate the skin.  You should call the office for your biopsy report after 1 week if you have not already been contacted.  If you experience any problems, such as abnormal amounts of bleeding, swelling, significant bruising, significant pain, or evidence of infection, please call the office immediately.  FOR ADULT SURGERY PATIENTS: If you need something for pain relief you may take 1 extra strength Tylenol (acetaminophen) AND 2 Ibuprofen ('200mg'$  each) together every 4 hours as needed for pain. (do not take these if you are allergic to them or if you have a reason you should not take them.) Typically, you may only need pain medication for 1 to 3 days.      Recommend daily broad spectrum sunscreen SPF 30+ to sun-exposed areas, reapply every 2 hours as needed. Call for new or changing lesions.  Staying in the shade or wearing long sleeves, sun glasses (UVA+UVB protection) and wide brim hats (4-inch brim around the entire circumference of the hat) are also recommended for sun protection.    Seborrheic Keratosis  What causes seborrheic keratoses? Seborrheic keratoses are harmless, common skin growths that first appear during adult life.  As time goes by, more growths appear.  Some people may develop a large number of them.  Seborrheic keratoses appear on both  covered and uncovered body parts.  They are not caused by sunlight.  The tendency to develop seborrheic keratoses can be inherited.  They vary in color from skin-colored to gray, brown, or even black.  They can be either smooth or have a rough, warty surface.   Seborrheic keratoses are superficial and look as if they were stuck on the skin.  Under the microscope this type of keratosis looks like layers upon layers of skin.  That is why at times the top layer may seem to fall off, but the rest of the growth remains and re-grows.    Treatment Seborrheic keratoses do not need to be treated, but can easily be removed in the office.  Seborrheic keratoses often cause symptoms when they rub on clothing or jewelry.  Lesions can be in the way of shaving.  If they become inflamed, they can cause itching, soreness, or burning.  Removal of a seborrheic keratosis can be accomplished by freezing, burning, or surgery. If any spot bleeds, scabs, or grows rapidly, please return to have it checked, as these can be an indication of a skin cancer.    Melanoma ABCDEs  Melanoma is the most dangerous type of skin cancer, and is the leading cause of death from skin disease.  You are more likely to develop melanoma if you: Have light-colored skin, light-colored eyes, or red or blond hair Spend a lot of time in the sun Tan regularly, either outdoors or in a tanning bed Have had blistering sunburns,  especially during childhood Have a close family member who has had a melanoma Have atypical moles or large birthmarks  Early detection of melanoma is key since treatment is typically straightforward and cure rates are extremely high if we catch it early.   The first sign of melanoma is often a change in a mole or a new dark spot.  The ABCDE system is a way of remembering the signs of melanoma.  A for asymmetry:  The two halves do not match. B for border:  The edges of the growth are irregular. C for color:  A mixture of  colors are present instead of an even brown color. D for diameter:  Melanomas are usually (but not always) greater than 10m - the size of a pencil eraser. E for evolution:  The spot keeps changing in size, shape, and color.  Please check your skin once per month between visits. You can use a small mirror in front and a large mirror behind you to keep an eye on the back side or your body.   If you see any new or changing lesions before your next follow-up, please call to schedule a visit.  Please continue daily skin protection including broad spectrum sunscreen SPF 30+ to sun-exposed areas, reapplying every 2 hours as needed when you're outdoors.   Staying in the shade or wearing long sleeves, sun glasses (UVA+UVB protection) and wide brim hats (4-inch brim around the entire circumference of the hat) are also recommended for sun protection.    Due to recent changes in healthcare laws, you may see results of your pathology and/or laboratory studies on MyChart before the doctors have had a chance to review them. We understand that in some cases there may be results that are confusing or concerning to you. Please understand that not all results are received at the same time and often the doctors may need to interpret multiple results in order to provide you with the best plan of care or course of treatment. Therefore, we ask that you please give u[place]2 business days to thoroughly review all your results before contacting the office for clarification. Should we see a critical lab result, you will be contacted sooner.   If You Need Anything After Your Visit  If you have any questions or concerns for your doctor, please call our main line at [phone] press option 4 to reach your doctor's medical assistant. If no one answers, please leave a voicemail as directed and we will return your call as soon as possible. Messages left after 4 pm will be answered the following business day.   You may also send  [place] message via [place] We typically respond to MyChart messages within 1-2 business days.  For prescription refills, please ask your pharmacy to contact our office. Our fax number is [phone]  If you have an urgent issue when the clinic is closed that cannot wait until the next business day, you can page your doctor at the number below.    Please note that while we do our best to be available for urgent issues outside of office hours, we are not available 24/7.   If you have an urgent issue and are unable to reach u[place] you may choose to seek medical care at your doctor's office, retail clinic, urgent care center, or emergency room.  If you have a medical emergency, please immediately call 911 or go to the emergency department.  Pager Numbers  - Dr. KNehemiah Massed [phone] -  Dr. Laurence Ferrari: [phone]  - Dr. Nicole Kindred: [phone]  In the event of inclement weather, please call our main line at [phone] for an update on the status of any delays or closures.  Dermatology Medication Tips: Please keep the boxes that topical medications come in in order to help keep track of the instructions about where and how to use these. Pharmacies typically print the medication instructions only on the boxes and not directly on the medication tubes.   If your medication is too expensive, please contact our office at [phone] option 4 or send [place] a message through [place].   We are unable to tell what your co-pay for medications will be in advance as this is different depending on your insurance coverage. However, we may be able to find a substitute medication at lower cost or fill out paperwork to get insurance to cover a needed medication.   If a prior authorization is required to get your medication covered by your insurance company, please allow [place] 1-2 business days to complete this process.  Drug prices often vary depending on where the prescription is filled and some pharmacies may offer  cheaper prices.  The website www.goodrx.com contains coupons for medications through different pharmacies. The prices here do not account for what the cost may be with help from insurance (it may be cheaper with your insurance), but the website can give you the price if you did not use any insurance.  - You can print the associated coupon and take it with your prescription to the pharmacy.  - You may also stop by our office during regular business hours and pick up a GoodRx coupon card.  - If you need your prescription sent electronically to a different pharmacy, notify our office through [facility] or by phone at [phone] option 4.     Si Usted Necesita Algo Despus de Su Visita  Tambin puede enviarnos un mensaje a travs de Pharmacist, community. Por lo general respondemos a los mensajes de MyChart en el transcurso de 1 a 2 das hbiles.  Para renovar recetas, por favor pida a su farmacia que se ponga en contacto con nuestra oficina. Harland Dingwall de fax es [place] [phone].  Si tiene un asunto urgente cuando la clnica est cerrada y que no puede esperar hasta el siguiente da hbil, puede llamar/localizar a su doctor(a) al nmero que aparece a continuacin.   Por favor, tenga en cuenta que aunque hacemos todo lo posible para estar disponibles para asuntos urgentes fuera del horario de [place], no estamos disponibles las 24 horas del da, los 7 das de la [place].   Si tiene un problema urgente y no puede comunicarse con nosotros, puede optar por buscar atencin mdica  en el consultorio de su doctor(a), en una clnica privada, en un centro de atencin urgente o en una sala de emergencias.  Si tiene Engineering geologist, por favor llame inmediatamente al 911 o vaya a la sala de emergencias.  Nmeros de bper  - Dr. Nehemiah Massed: [phone]  - Dra. Moye: [phone]  - Dra. Nicole Kindred: [phone]  En caso de inclemencias del [place], por favor llame a Johnsie Kindred principal al  [phone] para una actualizacin sobre el [place] de cualquier retraso o cierre.  Consejos para la medicacin en dermatologa: Por favor, guarde las cajas en las que vienen los medicamentos de uso tpico para ayudarle a seguir las instrucciones sobre dnde y cmo usarlos. Las farmacias generalmente imprimen las instrucciones del medicamento slo en las cajas y  no directamente en los tubos del medicamento.   Si su medicamento es muy caro, por favor, pngase en contacto con Zigmund Daniel llamando al [phone] y presione la opcin 4 o envenos un mensaje a travs de Pharmacist, community.   No podemos decirle cul ser su copago por los medicamentos por adelantado ya que esto es diferente dependiendo de la cobertura de su seguro. Sin embargo, es posible que podamos encontrar un medicamento sustituto a Electrical engineer un formulario para que el seguro cubra el medicamento que se considera necesario.   Si se requiere una autorizacin previa para que su compaa de seguros [place] su medicamento, por favor permtanos de 1 a 2 das hbiles para completar este proceso.  Los precios de los medicamentos varan con frecuencia dependiendo del Environmental consultant de dnde se surte la receta y alguna farmacias pueden ofrecer precios ms baratos.  El sitio web www.goodrx.com tiene cupones para medicamentos de Airline pilot. Los precios aqu no tienen en cuenta lo que podra costar con la ayuda del seguro (puede ser ms barato con su seguro), pero el sitio web puede darle el precio si no utiliz Research scientist (physical sciences).  - Puede imprimir el cupn correspondiente y llevarlo con su receta a la farmacia.  - Tambin puede pasar por nuestra oficina durante el horario de atencin regular y Charity fundraiser una tarjeta de cupones de GoodRx.  - Si necesita que su receta se enve electrnicamente a una farmacia diferente, informe a nuestra oficina a travs de MyChart de [place] o por telfono llamando al [phone] y presione la opcin 4.

## 2022-06-10 NOTE — Progress Notes (Unsigned)
New Patient Visit  Subjective  Christopher Shannon is a 61 y.o. male who presents for the following: Annual Exam (No personal hx of skin cancer or dysplastic nevi). The patient presents for Upper Body Skin Exam (UBSE) for skin cancer screening and mole check.  The patient has spots, moles and lesions to be evaluated, some may be new or changing and the patient has concerns that these could be cancer.  Review of Systems: No other skin or systemic complaints except as noted in HPI or Assessment and Plan.  Objective  Well appearing patient in no apparent distress; mood and affect are within normal limits.  A focused examination was performed including head, including the scalp, face, neck, nose, ears, eyelids, and lips. Relevant physical exam findings are noted in the Assessment and Plan.  Above Left Eyebrow 0.5cm firm subcutaneous papule  Left Forehead 0.4 x 0.3cm blue dark macule     Left Nasal Sidewall 0.5cm red flat papule      Assessment & Plan   Lentigines - Scattered tan macules - Due to sun exposure - Benign-appearing, observe - Recommend daily broad spectrum sunscreen SPF 30+ to sun-exposed areas, reapply every 2 hours as needed. - Call for any changes  Seborrheic Keratoses - Stuck-on, waxy, tan-brown papules and/or plaques  - Benign-appearing - Discussed benign etiology and prognosis. - Observe - Call for any changes  Melanocytic Nevi - Tan-brown and/or pink-flesh-colored symmetric macules and papules - Benign appearing on exam today - Observation - Call clinic for new or changing moles - Recommend daily use of broad spectrum spf 30+ sunscreen to sun-exposed areas.   Hemangiomas - Red papules - Discussed benign nature - Observe - Call for any changes  Actinic Damage - Chronic condition, secondary to cumulative UV/sun exposure - diffuse scaly erythematous macules with underlying dyspigmentation - Recommend daily broad spectrum sunscreen SPF 30+  to sun-exposed areas, reapply every 2 hours as needed.  - Staying in the shade or wearing long sleeves, sun glasses (UVA+UVB protection) and wide brim hats (4-inch brim around the entire circumference of the hat) are also recommended for sun protection.  - Call for new or changing lesions.  Skin cancer screening performed today.  Epidermal inclusion cyst Above Left Eyebrow Benign-appearing. Exam most consistent with an epidermal inclusion cyst. Discussed that a cyst is a benign growth that can grow over time and sometimes get irritated or inflamed. Recommend observation if it is not bothersome. Discussed option of surgical excision to remove it if it is growing, symptomatic, or other changes noted. Please call for new or changing lesions so they can be evaluated.  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  Benign-appearing.  Observation.  Call clinic for new or changing lesions.  Recommend daily use of broad spectrum spf 30+ sunscreen to sun-exposed areas.   Neoplasm of skin Left Nasal Sidewall Skin / nail biopsy Type of biopsy: tangential   Informed consent: discussed and consent obtained   Timeout: patient name, date of birth, surgical site, and procedure verified   Procedure prep:  Patient was prepped and draped in usual sterile fashion Prep type:  Isopropyl alcohol Anesthesia: the lesion was anesthetized in a standard fashion   Anesthetic:  1% lidocaine w/ epinephrine 1-100,000 buffered w/ 8.4% NaHCO3 Instrument used: flexible razor blade   Hemostasis achieved with: pressure, aluminum chloride and electrodesiccation   Outcome: patient tolerated procedure well   Post-procedure details: sterile dressing applied and wound care  instructions given   Dressing type: bandage and petrolatum    Specimen 1 - Surgical pathology Differential Diagnosis: R/O BCC Check Margins: No  Return in about 1 year (around 06/11/2023) for TBSE.  I, Emelia Salisbury, CMA, am acting as scribe for Sarina Ser, MD. Documentation: I have reviewed the above documentation for accuracy and completeness, and I agree with the above.  Sarina Ser, MD

## 2022-06-12 ENCOUNTER — Encounter: Payer: Self-pay | Admitting: Dermatology

## 2022-06-17 ENCOUNTER — Telehealth: Payer: Self-pay

## 2022-06-17 NOTE — Telephone Encounter (Signed)
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

## 2022-06-17 NOTE — Telephone Encounter (Signed)
Advised patient of biopsy results and scheduled for EDC/hd

## 2022-07-12 ENCOUNTER — Ambulatory Visit: Payer: Managed Care, Other (non HMO) | Admitting: Dermatology

## 2022-07-12 DIAGNOSIS — D0439 Carcinoma in situ of skin of other parts of face: Secondary | ICD-10-CM | POA: Diagnosis not present

## 2022-07-12 DIAGNOSIS — D099 Carcinoma in situ, unspecified: Secondary | ICD-10-CM

## 2022-07-12 NOTE — Progress Notes (Signed)
   Follow-Up Visit   Subjective  Christopher Shannon is a 61 y.o. 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  The following portions of the chart were reviewed this encounter and updated as appropriate:   Tobacco  Allergies  Meds  Problems  Med Hx  Surg Hx  Fam Hx     Review of Systems:  No other skin or systemic complaints except as noted in HPI or Assessment and Plan.  Objective  Well appearing patient in no apparent distress; mood and affect are within normal limits.  A focused examination was performed including face. Relevant physical exam findings are noted in the Assessment and Plan.  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  Destruction of lesion Complexity: extensive   Destruction method: electrodesiccation and curettage   Informed consent: discussed and consent obtained   Timeout:  patient name, date of birth, surgical site, and procedure verified Procedure prep:  Patient was prepped and draped in usual sterile fashion Prep type:  Isopropyl alcohol Anesthesia: the lesion was anesthetized in a standard fashion   Anesthetic:  1% lidocaine w/ epinephrine 1-100,000 buffered w/ 8.4% NaHCO3 Curettage performed in three different directions: Yes   Electrodesiccation performed over the curetted area: Yes   Final wound size (cm):  1.5 Hemostasis achieved with:  pressure and aluminum chloride Outcome: patient tolerated procedure well with no complications   Post-procedure details: sterile dressing applied and wound care instructions given   Dressing type: bandage and petrolatum    Discussed treatment options - LN2 vs EDC vs topical treatment vs EDC and topical treatment vs excision. Recommend EDC today and will plan topical chemotherapy treatment on follow up.   Return in about 6 weeks (around 08/22/2022) for Follow up.  I,  Ashok Cordia, CMA, am acting as scribe for Sarina Ser, MD . Documentation: I have reviewed the above documentation for accuracy and completeness, and I agree with the above.  Sarina Ser, MD

## 2022-07-12 NOTE — Patient Instructions (Signed)
Wound Care Instructions  Cleanse wound gently with soap and water once a day then pat dry with clean gauze. Apply a thin coat of Petrolatum (petroleum jelly, "Vaseline") over the wound (unless you have an allergy to this). We recommend that you use a new, sterile tube of Vaseline. Do not pick or remove scabs. Do not remove the yellow or white "healing tissue" from the base of the wound.  Cover the wound with fresh, clean, nonstick gauze and secure with paper tape. You may use Band-Aids in place of gauze and tape if the wound is small enough, but would recommend trimming much of the tape off as there is often too much. Sometimes Band-Aids can irritate the skin.  You should call the office for your biopsy report after 1 week if you have not already been contacted.  If you experience any problems, such as abnormal amounts of bleeding, swelling, significant bruising, significant pain, or evidence of infection, please call the office immediately.  FOR ADULT SURGERY PATIENTS: If you need something for pain relief you may take 1 extra strength Tylenol (acetaminophen) AND 2 Ibuprofen (200mg each) together every 4 hours as needed for pain. (do not take these if you are allergic to them or if you have a reason you should not take them.) Typically, you may only need pain medication for 1 to 3 days.     Due to recent changes in healthcare laws, you may see results of your pathology and/or laboratory studies on MyChart before the doctors have had a chance to review them. We understand that in some cases there may be results that are confusing or concerning to you. Please understand that not all results are received at the same time and often the doctors may need to interpret multiple results in order to provide you with the best plan of care or course of treatment. Therefore, we ask that you please give us 2 business days to thoroughly review all your results before contacting the office for clarification. Should  we see a critical lab result, you will be contacted sooner.   If You Need Anything After Your Visit  If you have any questions or concerns for your doctor, please call our main line at [phone] and press option 4 to reach your doctor's medical assistant. If no one answers, please leave a voicemail as directed and we will return your call as soon as possible. Messages left after 4 pm will be answered the following business day.   You may also send us a message via MyChart. We typically respond to MyChart messages within 1-2 business days.  For prescription refills, please ask your pharmacy to contact our office. Our fax number is [phone].  If you have an urgent issue when the clinic is closed that cannot wait until the next business day, you can page your doctor at the number below.    Please note that while we do our best to be available for urgent issues outside of office hours, we are not available 24/7.   If you have an urgent issue and are unable to reach us, you may choose to seek medical care at your doctor's office, retail clinic, urgent care center, or emergency room.  If you have a medical emergency, please immediately call 911 or go to the emergency department.  Pager Numbers  - Dr. Kowalski: [phone]  - Dr. Moye: [phone]  - Dr. Stewart: [phone]  In the event of inclement weather, please call our main line at   [phone] for an update on the status of any delays or closures.  Dermatology Medication Tips: Please keep the boxes that topical medications come in in order to help keep track of the instructions about where and how to use these. Pharmacies typically print the medication instructions only on the boxes and not directly on the medication tubes.   If your medication is too expensive, please contact our office at [phone] option 4 or send us a message through MyChart.   We are unable to tell what your co-pay for medications will be in  advance as this is different depending on your insurance coverage. However, we may be able to find a substitute medication at lower cost or fill out paperwork to get insurance to cover a needed medication.   If a prior authorization is required to get your medication covered by your insurance company, please allow us 1-2 business days to complete this process.  Drug prices often vary depending on where the prescription is filled and some pharmacies may offer cheaper prices.  The website www.goodrx.com contains coupons for medications through different pharmacies. The prices here do not account for what the cost may be with help from insurance (it may be cheaper with your insurance), but the website can give you the price if you did not use any insurance.  - You can print the associated coupon and take it with your prescription to the pharmacy.  - You may also stop by our office during regular business hours and pick up a GoodRx coupon card.  - If you need your prescription sent electronically to a different pharmacy, notify our office through [facility] MyChart or by phone at [phone] option 4.     Si Usted Necesita Algo Despus de Su Visita  Tambin puede enviarnos un mensaje a travs de MyChart. Por lo general respondemos a los mensajes de MyChart en el transcurso de 1 a 2 das hbiles.  Para renovar recetas, por favor pida a su farmacia que se ponga en contacto con nuestra oficina. Nuestro nmero de fax es el [phone].  Si tiene un asunto urgente cuando la clnica est cerrada y que no puede esperar hasta el siguiente da hbil, puede llamar/localizar a su doctor(a) al nmero que aparece a continuacin.   Por favor, tenga en cuenta que aunque hacemos todo lo posible para estar disponibles para asuntos urgentes fuera del horario de oficina, no estamos disponibles las 24 horas del da, los 7 das de la semana.   Si tiene un problema urgente y no puede comunicarse con nosotros, puede  optar por buscar atencin mdica  en el consultorio de su doctor(a), en una clnica privada, en un centro de atencin urgente o en una sala de emergencias.  Si tiene una emergencia mdica, por favor llame inmediatamente al 911 o vaya a la sala de emergencias.  Nmeros de bper  - Dr. Kowalski: [phone]  - Dra. Moye: [phone]  - Dra. Stewart: [phone]  En caso de inclemencias del tiempo, por favor llame a nuestra lnea principal al [phone] para una actualizacin sobre el estado de cualquier retraso o cierre.  Consejos para la medicacin en dermatologa: Por favor, guarde las cajas en las que vienen los medicamentos de uso tpico para ayudarle a seguir las instrucciones sobre dnde y cmo usarlos. Las farmacias generalmente imprimen las instrucciones del medicamento slo en las cajas y no directamente en los tubos del medicamento.   Si su medicamento es muy caro, por favor, pngase en contacto con   nuestra oficina llamando al [phone] y presione la opcin 4 o envenos un mensaje a travs de MyChart.   No podemos decirle cul ser su copago por los medicamentos por adelantado ya que esto es diferente dependiendo de la cobertura de su seguro. Sin embargo, es posible que podamos encontrar un medicamento sustituto a menor costo o llenar un formulario para que el seguro cubra el medicamento que se considera necesario.   Si se requiere una autorizacin previa para que su compaa de seguros cubra su medicamento, por favor permtanos de 1 a 2 das hbiles para completar este proceso.  Los precios de los medicamentos varan con frecuencia dependiendo del lugar de dnde se surte la receta y alguna farmacias pueden ofrecer precios ms baratos.  El sitio web www.goodrx.com tiene cupones para medicamentos de diferentes farmacias. Los precios aqu no tienen en cuenta lo que podra costar con la ayuda del seguro (puede ser ms barato con su seguro), pero el sitio web puede darle el  precio si no utiliz ningn seguro.  - Puede imprimir el cupn correspondiente y llevarlo con su receta a la farmacia.  - Tambin puede pasar por nuestra oficina durante el horario de atencin regular y recoger una tarjeta de cupones de GoodRx.  - Si necesita que su receta se enve electrnicamente a una farmacia diferente, informe a nuestra oficina a travs de MyChart de [facility] o por telfono llamando al [phone] y presione la opcin 4.  

## 2022-07-20 ENCOUNTER — Encounter: Payer: Self-pay | Admitting: Dermatology

## 2022-08-01 ENCOUNTER — Encounter: Payer: Managed Care, Other (non HMO) | Admitting: Family Medicine

## 2022-08-22 ENCOUNTER — Ambulatory Visit (INDEPENDENT_AMBULATORY_CARE_PROVIDER_SITE_OTHER): Payer: Managed Care, Other (non HMO) | Admitting: Dermatology

## 2022-08-22 VITALS — BP 131/74 | HR 65

## 2022-08-22 DIAGNOSIS — Z5111 Encounter for antineoplastic chemotherapy: Secondary | ICD-10-CM

## 2022-08-22 DIAGNOSIS — D099 Carcinoma in situ, unspecified: Secondary | ICD-10-CM

## 2022-08-22 DIAGNOSIS — Z79899 Other long term (current) drug therapy: Secondary | ICD-10-CM

## 2022-08-22 DIAGNOSIS — L578 Other skin changes due to chronic exposure to nonionizing radiation: Secondary | ICD-10-CM | POA: Diagnosis not present

## 2022-08-22 DIAGNOSIS — D0439 Carcinoma in situ of skin of other parts of face: Secondary | ICD-10-CM

## 2022-08-22 MED ORDER — FLUOROURACIL 5 % EX CREA: TOPICAL_CREAM | CUTANEOUS | 0 refills | Status: DC

## 2022-08-22 NOTE — Patient Instructions (Signed)
5-Fluorouracil/Calcipotriene Patient Education   Actinic keratoses are the dry, red scaly spots on the skin caused by sun damage. A portion of these spots can turn into skin cancer with time, and treating them can help prevent development of skin cancer.   Treatment of these spots requires removal of the defective skin cells. There are various ways to remove actinic keratoses, including freezing with liquid nitrogen, treatment with creams, or treatment with a blue light procedure in the office.   5-fluorouracil cream is a topical cream used to treat actinic keratoses. It works by interfering with the growth of abnormal fast-growing skin cells, such as actinic keratoses. These cells peel off and are replaced by healthy ones. THIS CREAM SHOULD BE KEPT OUT OF REACH OF CHILDREN AND PETS AND SHOULD NOT BE USED BY PREGNANT WOMEN.  5-fluorouracil/calcipotriene is a combination of the 5-fluorouracil cream with a vitamin D analog cream called calcipotriene. The calcipotriene alone does not treat actinic keratoses. However, when it is combined with 5-fluorouracil, it helps the 5-fluorouracil treat the actinic keratoses much faster so that the same results can be achieved with a much shorter treatment time.  INSTRUCTIONS FOR 5-FLUOROURACIL/CALCIPOTRIENE CREAM:   5-fluorouracil/calcipotriene cream typically only needs to be used for 4-7 days. A thin layer should be applied twice a day to the treatment areas recommended by your physician.   If your physician prescribed you separate tubes of 5-fluourouracil and calcipotriene, apply a thin layer of 5-fluorouracil followed by a thin layer of calcipotriene.   Avoid contact with your eyes or nostrils. Avoid applying the cream to your eyelids or lips unless directed to apply there by your physician. Do not use 5-fluorouracil/calcipotriene cream on infected or open wounds.   You will develop redness, irritation and some crusting at areas where you have pre-cancer  damage/actinic keratoses. IF YOU DEVELOP PAIN, BLEEDING, OR SIGNIFICANT CRUSTING, STOP THE TREATMENT EARLY - you have already gotten a good response and the actinic keratoses should clear up well.  Wash your hands after applying 5-fluorouracil 5% cream on your skin.   A moisturizer or sunscreen with a minimum SPF 30 should be applied each morning.   Once you have finished the treatment, you can apply a thin layer of Vaseline twice a day to irritated areas to soothe and calm the areas more quickly. If you experience significant discomfort, contact your physician.  For some patients it is necessary to repeat the treatment for best results.  SIDE EFFECTS: When using 5-fluorouracil/calcipotriene cream, you may have mild irritation, such as redness, dryness, swelling, or a mild burning sensation. This usually resolves within 2 weeks. The more actinic keratoses you have, the more redness and inflammation you can expect during treatment. Eye irritation has been reported rarely. If this occurs, please let us know.   If you have any trouble using this cream, please send us a MyChart message or call the office. If you have any other questions about this information, please do not hesitate to ask me before you leave the office or contact me on MyChart or by phone.   Instructions for [facility] Medications  One or more of your medications was sent to the [facility] mail order compounding pharmacy. You will receive an email from them and can purchase the medicine through that link. It will then be mailed to your home at the address you confirmed. If for any reason you do not receive an email from them, please check your spam folder. If you still do not   find the email, please let us know. [facility] phone number is [phone].   Due to recent changes in healthcare laws, you may see results of your pathology and/or laboratory studies on MyChart before the doctors have had a chance to review  them. We understand that in some cases there may be results that are confusing or concerning to you. Please understand that not all results are received at the same time and often the doctors may need to interpret multiple results in order to provide you with the best plan of care or course of treatment. Therefore, we ask that you please give us 2 business days to thoroughly review all your results before contacting the office for clarification. Should we see a critical lab result, you will be contacted sooner.   If You Need Anything After Your Visit  If you have any questions or concerns for your doctor, please call our main line at [phone] and press option 4 to reach your doctor's medical assistant. If no one answers, please leave a voicemail as directed and we will return your call as soon as possible. Messages left after 4 pm will be answered the following business day.   You may also send us a message via MyChart. We typically respond to MyChart messages within 1-2 business days.  For prescription refills, please ask your pharmacy to contact our office. Our fax number is [phone].  If you have an urgent issue when the clinic is closed that cannot wait until the next business day, you can page your doctor at the number below.    Please note that while we do our best to be available for urgent issues outside of office hours, we are not available 24/7.   If you have an urgent issue and are unable to reach us, you may choose to seek medical care at your doctor's office, retail clinic, urgent care center, or emergency room.  If you have a medical emergency, please immediately call 911 or go to the emergency department.  Pager Numbers  - Dr. Kowalski: [phone]  - Dr. Moye: [phone]  - Dr. Stewart: [phone]  In the event of inclement weather, please call our main line at [phone] for an update on the status of any delays or closures.  Dermatology Medication  Tips: Please keep the boxes that topical medications come in in order to help keep track of the instructions about where and how to use these. Pharmacies typically print the medication instructions only on the boxes and not directly on the medication tubes.   If your medication is too expensive, please contact our office at [phone] option 4 or send us a message through MyChart.   We are unable to tell what your co-pay for medications will be in advance as this is different depending on your insurance coverage. However, we may be able to find a substitute medication at lower cost or fill out paperwork to get insurance to cover a needed medication.   If a prior authorization is required to get your medication covered by your insurance company, please allow us 1-2 business days to complete this process.  Drug prices often vary depending on where the prescription is filled and some pharmacies may offer cheaper prices.  The website www.goodrx.com contains coupons for medications through different pharmacies. The prices here do not account for what the cost may be with help from insurance (it may be cheaper with your insurance), but the website can give you the price if you did   not use any insurance.  - You can print the associated coupon and take it with your prescription to the pharmacy.  - You may also stop by our office during regular business hours and pick up a GoodRx coupon card.  - If you need your prescription sent electronically to a different pharmacy, notify our office through [facility] MyChart or by phone at [phone] option 4.     Si Usted Necesita Algo Despus de Su Visita  Tambin puede enviarnos un mensaje a travs de MyChart. Por lo general respondemos a los mensajes de MyChart en el transcurso de 1 a 2 das hbiles.  Para renovar recetas, por favor pida a su farmacia que se ponga en contacto con nuestra oficina. Nuestro nmero de fax es el [phone].  Si tiene un  asunto urgente cuando la clnica est cerrada y que no puede esperar hasta el siguiente da hbil, puede llamar/localizar a su doctor(a) al nmero que aparece a continuacin.   Por favor, tenga en cuenta que aunque hacemos todo lo posible para estar disponibles para asuntos urgentes fuera del horario de oficina, no estamos disponibles las 24 horas del da, los 7 das de la semana.   Si tiene un problema urgente y no puede comunicarse con nosotros, puede optar por buscar atencin mdica  en el consultorio de su doctor(a), en una clnica privada, en un centro de atencin urgente o en una sala de emergencias.  Si tiene una emergencia mdica, por favor llame inmediatamente al 911 o vaya a la sala de emergencias.  Nmeros de bper  - Dr. Kowalski: [phone]  - Dra. Moye: [phone]  - Dra. Stewart: [phone]  En caso de inclemencias del tiempo, por favor llame a nuestra lnea principal al [phone] para una actualizacin sobre el estado de cualquier retraso o cierre.  Consejos para la medicacin en dermatologa: Por favor, guarde las cajas en las que vienen los medicamentos de uso tpico para ayudarle a seguir las instrucciones sobre dnde y cmo usarlos. Las farmacias generalmente imprimen las instrucciones del medicamento slo en las cajas y no directamente en los tubos del medicamento.   Si su medicamento es muy caro, por favor, pngase en contacto con nuestra oficina llamando al [phone] y presione la opcin 4 o envenos un mensaje a travs de MyChart.   No podemos decirle cul ser su copago por los medicamentos por adelantado ya que esto es diferente dependiendo de la cobertura de su seguro. Sin embargo, es posible que podamos encontrar un medicamento sustituto a menor costo o llenar un formulario para que el seguro cubra el medicamento que se considera necesario.   Si se requiere una autorizacin previa para que su compaa de seguros cubra su medicamento, por favor  permtanos de 1 a 2 das hbiles para completar este proceso.  Los precios de los medicamentos varan con frecuencia dependiendo del lugar de dnde se surte la receta y alguna farmacias pueden ofrecer precios ms baratos.  El sitio web www.goodrx.com tiene cupones para medicamentos de diferentes farmacias. Los precios aqu no tienen en cuenta lo que podra costar con la ayuda del seguro (puede ser ms barato con su seguro), pero el sitio web puede darle el precio si no utiliz ningn seguro.  - Puede imprimir el cupn correspondiente y llevarlo con su receta a la farmacia.  - Tambin puede pasar por nuestra oficina durante el horario de atencin regular y recoger una tarjeta de cupones de GoodRx.  - Si necesita que su receta se enve   electrnicamente a una farmacia diferente, informe a nuestra oficina a travs de MyChart de [facility] o por telfono llamando al [phone] y presione la opcin 4.  

## 2022-08-22 NOTE — Progress Notes (Signed)
   Follow-Up Visit   Subjective  Christopher Shannon is a 61 y.o. 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 Pt is here today for recheck to see if topical chemotherapy cream is needed). The patient has spots, moles and lesions to be evaluated, some may be new or changing and the patient has concerns that these could be cancer.  The following portions of the chart were reviewed this encounter and updated as appropriate:   Tobacco  Allergies  Meds  Problems  Med Hx  Surg Hx  Fam Hx     Review of Systems:  No other skin or systemic complaints except as noted in HPI or Assessment and Plan.  Objective  Well appearing patient in no apparent distress; mood and affect are within normal limits.  A focused examination was performed including the face. Relevant physical exam findings are noted in the Assessment and Plan.  L nasal side wall Pink biopsy site.    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  Start 5FU/Calcipotriene mix topical chemotherapy BID x 7-10 days to the L nasal bridge and left nasal sidewall. OK to stop at 7 days if area becomes red and inflamed, but restart once area has calmed down so cream is used for at least 7 to 10 days.   Actinic Damage - chronic, secondary to cumulative UV radiation exposure/sun exposure over time - diffuse scaly erythematous macules with underlying dyspigmentation - Recommend daily broad spectrum sunscreen SPF 30+ to sun-exposed areas, reapply every 2 hours as needed.  - Recommend staying in the shade or wearing long sleeves, sun glasses (UVA+UVB protection) and wide brim hats (4-inch brim around the entire circumference of the hat). - Call for new or changing lesions.  Return in about 3 months (around 11/21/2022) for SCCIS recheck  .  Luther Redo, CMA, am acting as scribe for Sarina Ser, MD . Documentation: I have reviewed the above documentation for accuracy and completeness, and I agree with the above.  Sarina Ser, MD

## 2022-08-31 ENCOUNTER — Encounter: Payer: Self-pay | Admitting: Dermatology

## 2022-11-13 ENCOUNTER — Ambulatory Visit (INDEPENDENT_AMBULATORY_CARE_PROVIDER_SITE_OTHER): Payer: Managed Care, Other (non HMO) | Admitting: Urology

## 2022-11-13 ENCOUNTER — Encounter: Payer: Self-pay | Admitting: Urology

## 2022-11-13 VITALS — BP 119/73 | HR 61 | Ht 70.0 in | Wt 184.0 lb

## 2022-11-13 DIAGNOSIS — R300 Dysuria: Secondary | ICD-10-CM | POA: Diagnosis not present

## 2022-11-13 DIAGNOSIS — R3129 Other microscopic hematuria: Secondary | ICD-10-CM | POA: Diagnosis not present

## 2022-11-13 DIAGNOSIS — N390 Urinary tract infection, site not specified: Secondary | ICD-10-CM

## 2022-11-13 DIAGNOSIS — R350 Frequency of micturition: Secondary | ICD-10-CM

## 2022-11-13 DIAGNOSIS — R3989 Other symptoms and signs involving the genitourinary system: Secondary | ICD-10-CM | POA: Diagnosis not present

## 2022-11-13 DIAGNOSIS — R3915 Urgency of urination: Secondary | ICD-10-CM | POA: Diagnosis not present

## 2022-11-13 LAB — MICROSCOPIC EXAMINATION: WBC, UA: >30 /hpf — AB (ref 0–5)

## 2022-11-13 LAB — URINALYSIS, COMPLETE
Bilirubin, UA: NEGATIVE
Glucose, UA: NEGATIVE
Ketones, UA: NEGATIVE
Nitrite, UA: POSITIVE — AB
RBC, UA: NEGATIVE
Specific Gravity, UA: 1.02 (ref 1.005–1.030)
Urobilinogen, Ur: 1 mg/dL (ref 0.2–1.0)
pH, UA: 5.5 (ref 5.0–7.5)

## 2022-11-13 LAB — BLADDER SCAN AMB NON-IMAGING

## 2022-11-13 MED ORDER — SULFAMETHOXAZOLE-TRIMETHOPRIM 800-160 MG PO TABS: 1.0000 | ORAL_TABLET | Freq: Two times a day (BID) | ORAL | 0 refills | Status: DC

## 2022-11-13 NOTE — Progress Notes (Signed)
11/13/2022 12:20 PM   [facility] [birth year] [id number]  Referring provider: Eulas Post, MD [place],  [place] [id number]  Urological history: 1. rUTI's -contributing factors of age, BPH and constipation -documented urine cultures over the last year  12/05/2021 - No Growth  2. Nephrolithiasis -RUS (12/2021) -  A 2 mm right renal nonobstructing renal stone cannot be excluded. A 3 mm nonobstructing left renal stone cannot be excluded  3. Renal cysts -RUS (12/2021) - Bilateral simple renal cysts  Chief Complaint  Patient presents with   Other    Painful urination    HPI: Christopher Shannon is a 62 y.o. male who presents today for UTI.  He has been experiencing dysuria, urgency and frequency for the last several days.  His last constipation for the last few weeks.  Patient denies any modifying or aggravating factors.  Patient denies any gross hematuria, dysuria or suprapubic/flank pain.  Patient denies any fevers, chills, nausea or vomiting.    He denied any perineal pain, painful bowel movements or diarrhea.  He did have some incidences of hematospermia that are rare and intermittent.  He did not have painful ejaculations.  UA yellow slightly cloudy, specific gravity, 1.020, pH 5.5, trace protein, nitrate positive, 1+ leukocyte, greater than 30 WBCs, 3-10 RBCs, 0-10 epithelial cells, granular casts are present, mucus threads are present and many bacteria.  PMH: Past Medical History:  Diagnosis Date   Anxiety 10/22/2017   Arm numbness 10/22/2017   Arthritis    Benign prostatic hyperplasia without urinary obstruction 10/24/2006   Fatigue 10/22/2017   GERD 07/06/2010   Qualifier: Diagnosis of  By: Loanne Drilling MD, Hilliard Clark A    GERD (gastroesophageal reflux disease)    NO MEDS   Hiatal hernia    Irritable bowel syndrome with constipation 01/04/2015   Worse after intentional wt loss.    Joint pain    Squamous cell carcinoma of skin 06/10/2022    Left nasal sidewall - needs EDC   Umbilical hernia without obstruction and without gangrene 04/15/2017    Surgical History: Past Surgical History:  Procedure Laterality Date   COLONOSCOPY  2011   INGUINAL HERNIA REPAIR Left 04/05/2020   Procedure: HERNIA REPAIR INGUINAL ADULT, open;  Surgeon: [name], MD;  Location: [facility] ORS;  Service: General;  Laterality: Left;   TONSILLECTOMY     AGE 58   UMBILICAL HERNIA REPAIR N/A 05/20/2018   Procedure: HERNIA REPAIR UMBILICAL ADULT;  Surgeon: Florene Glen, MD;  Location: [facility] ORS;  Service: General;  Laterality: N/A;   UPPER GI ENDOSCOPY      Home Medications:  Allergies as of 11/13/2022   No Known Allergies      Medication List        Accurate as of November 13, 2022 12:20 PM. If you have any questions, ask your nurse or doctor.          STOP taking these medications    fluorouracil 5 % cream Commonly known as: EFUDEX Stopped by: Delno Blaisdell, PA-C   tamsulosin 0.4 MG Caps capsule Commonly known as: FLOMAX Stopped by: Santo Zahradnik, PA-C   tobramycin-dexamethasone ophthalmic solution Commonly known as: TOBRADEX Stopped by: Zara Council, PA-C       TAKE these medications    sulfamethoxazole-trimethoprim 800-160 MG tablet Commonly known as: BACTRIM DS Take 1 tablet by mouth every 12 (twelve) hours. Started by: Zara Council, PA-C        Allergies: No Known Allergies  Family History: Family History  Problem Relation Age of Onset   Lupus Mother    Stomach cancer Mother    Thyroid disease Mother    Heart disease Father    Asthma Son     Social History:  reports that he has never smoked. He has never used smokeless tobacco. He reports that he does not drink alcohol and does not use drugs.  ROS: Pertinent ROS in HPI  Physical Exam: BP 119/73   Pulse 61   Ht '5\' 10"'$  (1.778 m)   Wt 184 lb (83.5 kg)   BMI 26.40 kg/m   Constitutional:  Well nourished. Alert and oriented, No acute  distress. HEENT: [place] AT, moist mucus membranes.  Trachea midline Cardiovascular: No clubbing, cyanosis, or edema. Respiratory: Normal respiratory effort, no increased work of breathing. Neurologic: Grossly intact, no focal deficits, moving all 4 extremities. Psychiatric: Normal mood and affect.  Laboratory Data: Urinalysis  See EPIC and HPI I have reviewed the labs.   Pertinent Imaging:  11/13/22 11:35  Scan Result 38m   Assessment & Plan:    1. Suspected UTI - Urinalysis, Complete-grossly infected - CULTURE, URINE COMPREHENSIVE - BLADDER SCAN AMB NON-IMAGING -Septra DS twice daily x 30  2. Microscopic hematuria -UA with microscopic hematuria -Advised him that we need to follow-up to ensure the microscopic hematuria clears with resolution of infection and if it persists it may be the sign of something more serious delayed further workup   Return in about 1 month (around 12/14/2022) for UA and symptom recheck .  These notes generated with voice recognition software. I apologize for typographical errors.  [place] [place] [phone]

## 2022-11-14 ENCOUNTER — Ambulatory Visit: Payer: Managed Care, Other (non HMO) | Admitting: Physician Assistant

## 2022-11-16 LAB — CULTURE, URINE COMPREHENSIVE

## 2022-11-18 ENCOUNTER — Telehealth: Payer: Self-pay | Admitting: Family Medicine

## 2022-11-18 NOTE — Telephone Encounter (Signed)
Pt returned call and I read message from Shannon. 

## 2022-11-18 NOTE — Telephone Encounter (Signed)
LMOM for patient to return call.

## 2022-11-18 NOTE — Telephone Encounter (Signed)
-----   Message from Nori Riis, PA-C sent at 11/17/2022  8:57 PM EDT ----- Please let Mr. Alvelo know that his urine culture was positive for infection and that the trimethoprim/sulfa DS is the appropriate antibiotic and to complete the prescription.  We will see him in April.

## 2022-11-21 ENCOUNTER — Ambulatory Visit: Payer: Managed Care, Other (non HMO) | Admitting: Dermatology

## 2022-12-10 ENCOUNTER — Ambulatory Visit: Payer: Managed Care, Other (non HMO) | Admitting: Urology

## 2022-12-24 ENCOUNTER — Ambulatory Visit: Payer: Managed Care, Other (non HMO) | Admitting: Urology

## 2023-01-21 ENCOUNTER — Ambulatory Visit: Payer: Managed Care, Other (non HMO) | Admitting: Urology

## 2023-04-07 ENCOUNTER — Ambulatory Visit: Payer: Managed Care, Other (non HMO) | Admitting: Urology

## 2023-06-18 ENCOUNTER — Encounter: Payer: Self-pay | Admitting: Dermatology

## 2023-06-18 ENCOUNTER — Ambulatory Visit (INDEPENDENT_AMBULATORY_CARE_PROVIDER_SITE_OTHER): Payer: Managed Care, Other (non HMO) | Admitting: Dermatology

## 2023-06-18 DIAGNOSIS — Z7189 Other specified counseling: Secondary | ICD-10-CM

## 2023-06-18 DIAGNOSIS — Z8589 Personal history of malignant neoplasm of other organs and systems: Secondary | ICD-10-CM

## 2023-06-18 DIAGNOSIS — D2271 Melanocytic nevi of right lower limb, including hip: Secondary | ICD-10-CM

## 2023-06-18 DIAGNOSIS — L720 Epidermal cyst: Secondary | ICD-10-CM | POA: Diagnosis not present

## 2023-06-18 DIAGNOSIS — L821 Other seborrheic keratosis: Secondary | ICD-10-CM

## 2023-06-18 DIAGNOSIS — Z1283 Encounter for screening for malignant neoplasm of skin: Secondary | ICD-10-CM

## 2023-06-18 DIAGNOSIS — L603 Nail dystrophy: Secondary | ICD-10-CM | POA: Diagnosis not present

## 2023-06-18 DIAGNOSIS — W908XXA Exposure to other nonionizing radiation, initial encounter: Secondary | ICD-10-CM

## 2023-06-18 DIAGNOSIS — D235 Other benign neoplasm of skin of trunk: Secondary | ICD-10-CM

## 2023-06-18 DIAGNOSIS — D229 Melanocytic nevi, unspecified: Secondary | ICD-10-CM

## 2023-06-18 DIAGNOSIS — B353 Tinea pedis: Secondary | ICD-10-CM

## 2023-06-18 DIAGNOSIS — L729 Follicular cyst of the skin and subcutaneous tissue, unspecified: Secondary | ICD-10-CM

## 2023-06-18 DIAGNOSIS — D2339 Other benign neoplasm of skin of other parts of face: Secondary | ICD-10-CM

## 2023-06-18 DIAGNOSIS — L814 Other melanin hyperpigmentation: Secondary | ICD-10-CM

## 2023-06-18 DIAGNOSIS — L578 Other skin changes due to chronic exposure to nonionizing radiation: Secondary | ICD-10-CM

## 2023-06-18 DIAGNOSIS — Z85828 Personal history of other malignant neoplasm of skin: Secondary | ICD-10-CM

## 2023-06-18 DIAGNOSIS — Z79899 Other long term (current) drug therapy: Secondary | ICD-10-CM

## 2023-06-18 MED ORDER — KETOCONAZOLE 2 % EX CREA: TOPICAL_CREAM | CUTANEOUS | 11 refills | Status: DC

## 2023-06-18 NOTE — Progress Notes (Signed)
Follow-Up Visit   Subjective  Christopher Shannon is a 62 y.o. male who presents for the following: Skin Cancer Screening and Full Body Skin Exam 3 spots left shoulder, left lower arm,and  forehead at hairline   The patient presents for Total-Body Skin Exam (TBSE) for skin cancer screening and mole check. The patient has spots, moles and lesions to be evaluated, some may be new or changing and the patient may have concern these could be cancer.   The following portions of the chart were reviewed this encounter and updated as appropriate: medications, allergies, medical history  Review of Systems:  No other skin or systemic complaints except as noted in HPI or Assessment and Plan.  Objective  Well appearing patient in no apparent distress; mood and affect are within normal limits.  A full examination was performed including scalp, head, eyes, ears, nose, lips, neck, chest, axillae, abdomen, back, buttocks, bilateral upper extremities, bilateral lower extremities, hands, feet, fingers, toes, fingernails, and toenails. All findings within normal limits unless otherwise noted below.   Relevant physical exam findings are noted in the Assessment and Plan.        Assessment & Plan   SKIN CANCER SCREENING PERFORMED TODAY.  Epidermal inclusion cyst Above Left Eyebrow Benign-appearing. Exam most consistent with an epidermal inclusion cyst. Discussed that a cyst is a benign growth that can grow over time and sometimes get irritated or inflamed. Recommend observation if it is not bothersome. Discussed option of surgical excision to remove it if it is growing, symptomatic, or other changes noted. Please call for new or changing lesions so they can be evaluated.   ACTINIC DAMAGE - Chronic condition, secondary to cumulative UV/sun exposure - diffuse scaly erythematous macules with underlying dyspigmentation - Recommend daily broad spectrum sunscreen SPF 30+ to sun-exposed areas, reapply  every 2 hours as needed.  - Staying in the shade or wearing long sleeves, sun glasses (UVA+UVB protection) and wide brim hats (4-inch brim around the entire circumference of the hat) are also recommended for sun protection.  - Call for new or changing lesions.  LENTIGINES, SEBORRHEIC KERATOSES, HEMANGIOMAS - Benign normal skin lesions - Benign-appearing - Call for any changes Sk at forehead and left forearm   MELANOCYTIC NEVI - Tan-brown and/or pink-flesh-colored symmetric macules and papules - Benign appearing on exam today - Observation - Call clinic for new or changing moles - Recommend daily use of broad spectrum spf 30+ sunscreen to sun-exposed areas.   Nevus  Right anterior thigh    Exam  0.6 x 0.4 cm regular brown macule see photo  Benign. Observe. Benign-appearing.  Observation.  Call clinic for new or changing lesions.  Recommend daily use of broad spectrum spf 30+ sunscreen to sun-exposed areas.   TINEA PEDIS with toenail dystrophy  Exam: Scaling and maceration web spaces and over distal and lateral soles. Chronic and persistent condition with duration or expected duration over one year. Condition is symptomatic / bothersome to patient. Not to goal.  Treatment Plan: Discussed Terbinafine Counseling  Terbinafine is an anti-fungal medicine that can be applied to the skin (over the counter) or taken by mouth (prescription) to treat fungal infections. The pill version is often used to treat fungal infections of the nails or scalp. While most people do not have any side effects from taking terbinafine pills, some possible side effects of the medicine can include taste changes, headache, loss of smell, vision changes, nausea, vomiting, or diarrhea.   Rare side effects can  include irritation of the liver, allergic reaction, or decrease in blood counts (which may show up as not feeling well or developing an infection). If you are concerned about any of these side effects, please  stop the medicine and call your doctor, or in the case of an emergency such as feeling very unwell, seek immediate medical care.   Patient prefers cream treatment   Start Ketoconazole cream apply to feet nightly    Blue nevus Left Forehead 0.4 x 0.3cm blue dark macule  Patient reports has been there over 30 years without changes.  Benign-appearing.  Observation.  Call clinic for new or changing lesions.  Recommend daily use of broad spectrum spf 30+ sunscreen to sun-exposed areas.   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  - Call if any new or changing lesions are noted between office visits   Return in about 1 year (around 06/17/2024) for TBSE.  IAsher Muir, CMA, am acting as scribe for Armida Sans, MD.   Documentation: I have reviewed the above documentation for accuracy and completeness, and I agree with the above.  Armida Sans, MD

## 2023-06-18 NOTE — Patient Instructions (Addendum)
Seborrheic Keratosis  What causes seborrheic keratoses? Seborrheic keratoses are harmless, common skin growths that first appear during adult life.  As time goes by, more growths appear.  Some people may develop a large number of them.  Seborrheic keratoses appear on both covered and uncovered body parts.  They are not caused by sunlight.  The tendency to develop seborrheic keratoses can be inherited.  They vary in color from skin-colored to gray, brown, or even black.  They can be either smooth or have a rough, warty surface.   Seborrheic keratoses are superficial and look as if they were stuck on the skin.  Under the microscope this type of keratosis looks like layers upon layers of skin.  That is why at times the top layer may seem to fall off, but the rest of the growth remains and re-grows.    Treatment Seborrheic keratoses do not need to be treated, but can easily be removed in the office.  Seborrheic keratoses often cause symptoms when they rub on clothing or jewelry.  Lesions can be in the way of shaving.  If they become inflamed, they can cause itching, soreness, or burning.  Removal of a seborrheic keratosis can be accomplished by freezing, burning, or surgery. If any spot bleeds, scabs, or grows rapidly, please return to have it checked, as these can be an indication of a skin cancer.    Melanoma ABCDEs  Melanoma is the most dangerous type of skin cancer, and is the leading cause of death from skin disease.  You are more likely to develop melanoma if you: Have light-colored skin, light-colored eyes, or red or blond hair Spend a lot of time in the sun Tan regularly, either outdoors or in a tanning bed Have had blistering sunburns, especially during childhood Have a close family member who has had a melanoma Have atypical moles or large birthmarks  Early detection of melanoma is key since treatment is typically straightforward and cure rates are extremely high if we catch it early.    The first sign of melanoma is often a change in a mole or a new dark spot.  The ABCDE system is a way of remembering the signs of melanoma.  A for asymmetry:  The two halves do not match. B for border:  The edges of the growth are irregular. C for color:  A mixture of colors are present instead of an even brown color. D for diameter:  Melanomas are usually (but not always) greater than 6mm - the size of a pencil eraser. E for evolution:  The spot keeps changing in size, shape, and color.  Please check your skin once per month between visits. You can use a small mirror in front and a large mirror behind you to keep an eye on the back side or your body.   If you see any new or changing lesions before your next follow-up, please call to schedule a visit.  Please continue daily skin protection including broad spectrum sunscreen SPF 30+ to sun-exposed areas, reapplying every 2 hours as needed when you're outdoors.   Staying in the shade or wearing long sleeves, sun glasses (UVA+UVB protection) and wide brim hats (4-inch brim around the entire circumference of the hat) are also recommended for sun protection.    Due to recent changes in healthcare laws, you may see results of your pathology and/or laboratory studies on MyChart before the doctors have had a chance to review them. We understand that in some cases there  may be results that are confusing or concerning to you. Please understand that not all results are received at the same time and often the doctors may need to interpret multiple results in order to provide you with the best plan of care or course of treatment. Therefore, we ask that you please give [place] 2 business days to thoroughly review all your results before contacting the office for clarification. Should we see a critical lab result, you will be contacted sooner.   If You Need Anything After Your Visit  If you have any questions or concerns for your doctor, please call our main  line at [phone] and press option 4 to reach your doctor's medical assistant. If no one answers, please leave a voicemail as directed and we will return your call as soon as possible. Messages left after 4 pm will be answered the following business day.   You may also send [place] a message via MyChart. We typically respond to MyChart messages within 1-2 business days.  For prescription refills, please ask your pharmacy to contact our office. Our fax number is [phone].  If you have an urgent issue when the clinic is closed that cannot wait until the next business day, you can page your doctor at the number below.    Please note that while we do our best to be available for urgent issues outside of office hours, we are not available 24/7.   If you have an urgent issue and are unable to reach [place], you may choose to seek medical care at your doctor's office, retail clinic, urgent care center, or emergency room.  If you have a medical emergency, please immediately call 911 or go to the emergency department.  Pager Numbers  - Dr. Gwen Pounds: [phone]  - Dr. Roseanne Reno: [phone]  - Dr. Katrinka Blazing: [phone]   In the event of inclement weather, please call our main line at [phone] for an update on the status of any delays or closures.  Dermatology Medication Tips: Please keep the boxes that topical medications come in in order to help keep track of the instructions about where and how to use these. Pharmacies typically print the medication instructions only on the boxes and not directly on the medication tubes.   If your medication is too expensive, please contact our office at [phone] option 4 or send [place] a message through MyChart.   We are unable to tell what your co-pay for medications will be in advance as this is different depending on your insurance coverage. However, we may be able to find a substitute medication at lower cost or fill out paperwork to get insurance to cover  a needed medication.   If a prior authorization is required to get your medication covered by your insurance company, please allow [place] 1-2 business days to complete this process.  Drug prices often vary depending on where the prescription is filled and some pharmacies may offer cheaper prices.  The website www.goodrx.com contains coupons for medications through different pharmacies. The prices here do not account for what the cost may be with help from insurance (it may be cheaper with your insurance), but the website can give you the price if you did not use any insurance.  - You can print the associated coupon and take it with your prescription to the pharmacy.  - You may also stop by our office during regular business hours and pick up a GoodRx coupon card.  - If you need your prescription sent electronically  to a different pharmacy, notify our office through [facility] or by phone at [phone] option 4.     Si Usted Necesita Algo Despus de Su Visita  Tambin puede enviarnos un mensaje a travs de Clinical cytogeneticist. Por lo general respondemos a los mensajes de MyChart en el transcurso de 1 a 2 das hbiles.  Para renovar recetas, por favor pida a su farmacia que se ponga en contacto con nuestra oficina. Annie Sable de fax es [place] [phone].  Si tiene un asunto urgente cuando la clnica est cerrada y que no puede esperar hasta el siguiente da hbil, puede llamar/localizar a su doctor(a) al nmero que aparece a continuacin.   Por favor, tenga en cuenta que aunque hacemos todo lo posible para estar disponibles para asuntos urgentes fuera del horario de [place], no estamos disponibles las 24 horas del da, los 7 [address]  [address] de la [place].   Si tiene un problema urgente y no puede comunicarse con nosotros, puede optar por buscar atencin mdica  en el consultorio de su doctor(a), en una clnica privada, en un centro de atencin urgente o en una sala de emergencias.  Si tiene Psychologist, clinical, por favor llame inmediatamente al 911 o vaya a la sala de emergencias.  Nmeros de bper  - Dr. Gwen Pounds: [phone]  - Dra. Roseanne Reno: [phone]  - Dr. Katrinka Blazing: [phone]   En caso de inclemencias del tiempo, por favor llame a Lacy Duverney principal al [phone] para una actualizacin sobre el [place] de cualquier retraso o cierre.  Consejos para la medicacin en dermatologa: Por favor, guarde las cajas en las que vienen los medicamentos de uso tpico para ayudarle a seguir las instrucciones sobre dnde y cmo usarlos. Las farmacias generalmente imprimen las instrucciones del medicamento slo en las cajas y no directamente en los tubos del [place].   Si su medicamento es muy caro, por favor, pngase en contacto con Rolm [place] llamando al [phone] y presione la opcin 4 o envenos un mensaje a travs de Clinical cytogeneticist.   No podemos decirle cul ser su copago por los medicamentos por adelantado ya que esto es diferente dependiendo de la cobertura de su seguro. Sin embargo, es posible que podamos encontrar un medicamento sustituto a Audiological scientist un formulario para que el seguro cubra el medicamento que se considera necesario.   Si se requiere una autorizacin previa para que su compaa de seguros [place] su medicamento, por favor permtanos de 1 a 2 das hbiles para completar [address]  Los precios de los medicamentos varan con frecuencia dependiendo del Environmental consultant de dnde se surte la receta y alguna farmacias pueden ofrecer precios ms baratos.  El sitio web www.goodrx.com tiene cupones para medicamentos de Health and safety inspector. Los precios aqu no tienen en cuenta lo que podra costar con la ayuda del seguro (puede ser ms barato con su seguro), pero el sitio web puede darle el precio si no utiliz Tourist information centre manager.  - Puede imprimir el cupn correspondiente y llevarlo con su receta a la farmacia.  - Tambin puede pasar por nuestra oficina durante el horario de  atencin regular y Education officer, museum una tarjeta de cupones de GoodRx.  - Si necesita que su receta se enve electrnicamente a una farmacia diferente, informe a nuestra oficina a travs de MyChart de [place] o por telfono llamando al [phone] y presione la opcin 4.

## 2023-06-27 ENCOUNTER — Encounter: Payer: Self-pay | Admitting: Dermatology

## 2023-11-22 IMAGING — US US RENAL
1 series · 14 of 25 positions shown · non-contrast
Comparison: No prior.

CLINICAL DATA: Recurrent urinary tract infection.

EXAM:
RENAL / URINARY TRACT ULTRASOUND COMPLETE

[Series 1: us renal · 14 of 53 slices shown]
[im 1/53]
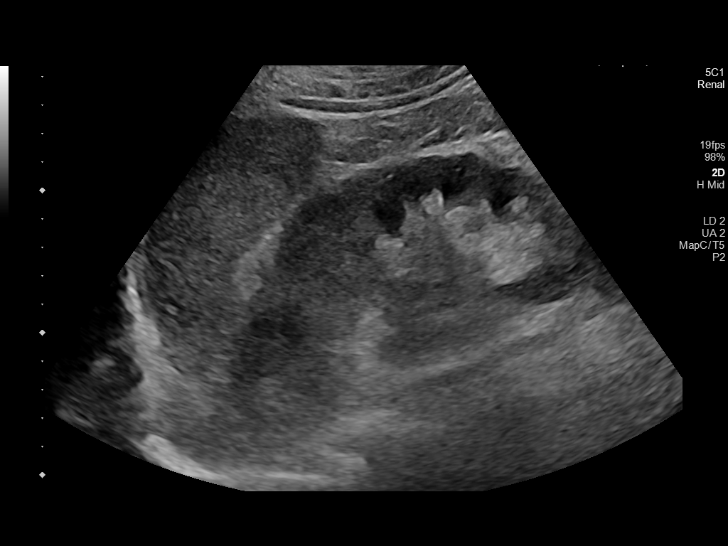
[im 5/53]
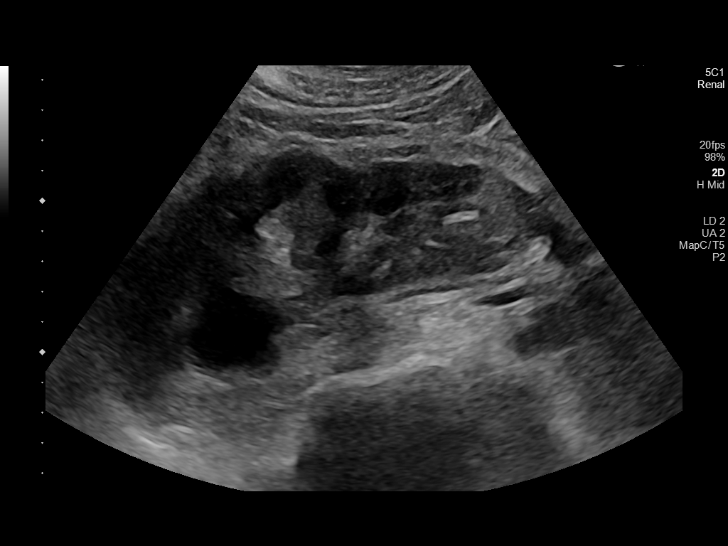
[im 9/53]
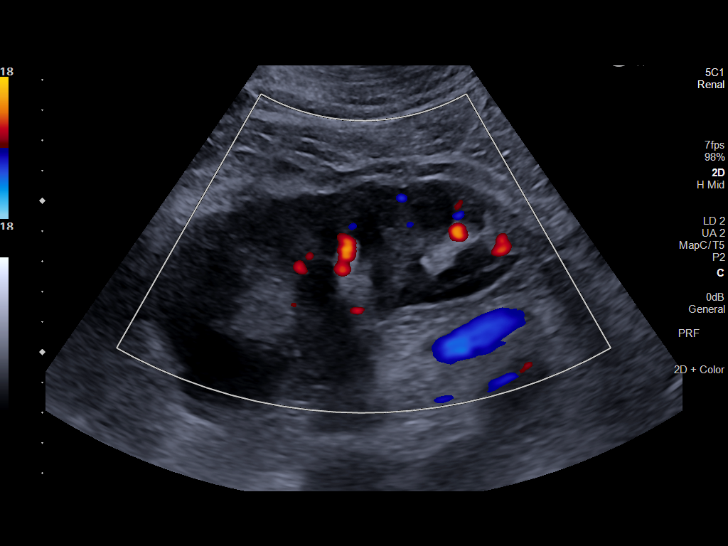
[im 14/53]
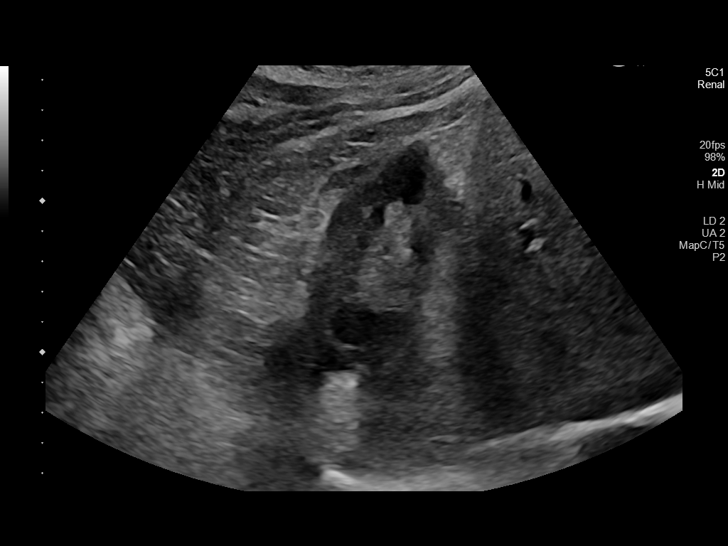
[im 18/53]
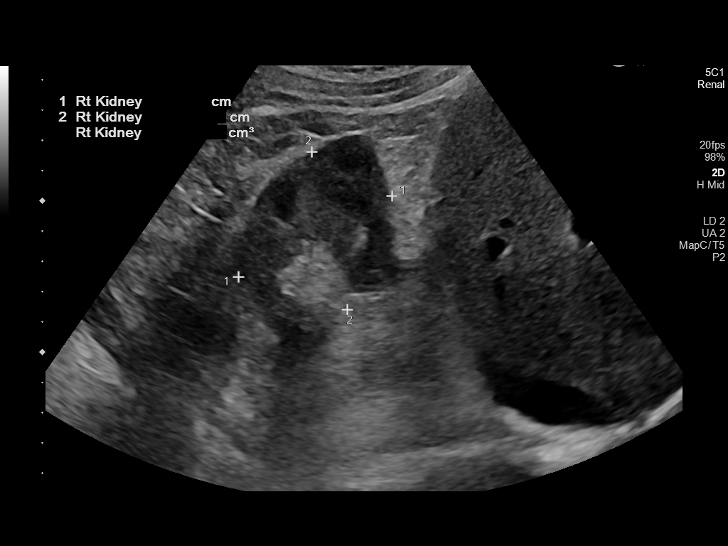
[im 20/53]
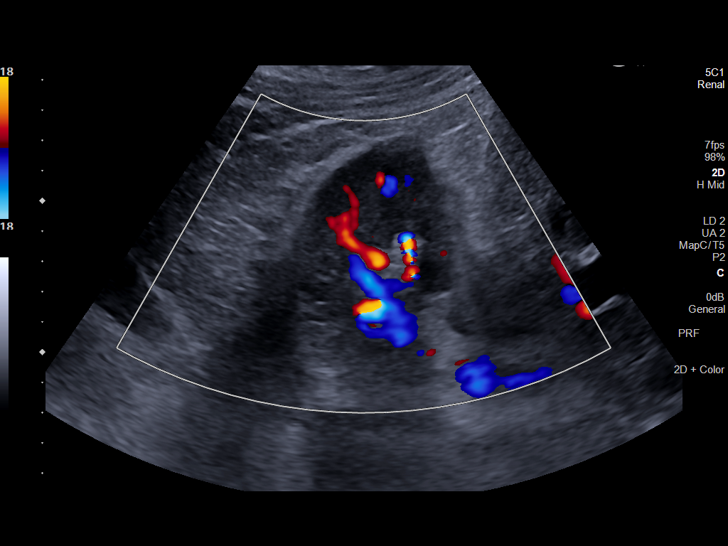
[im 24/53]
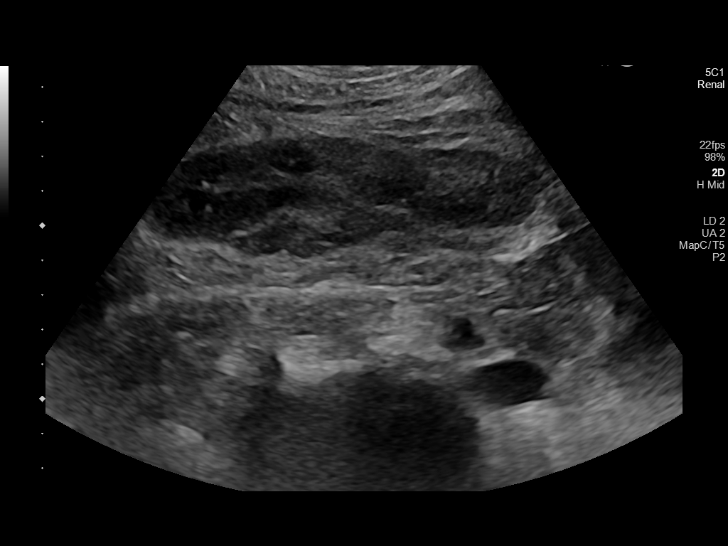
[im 29/53]
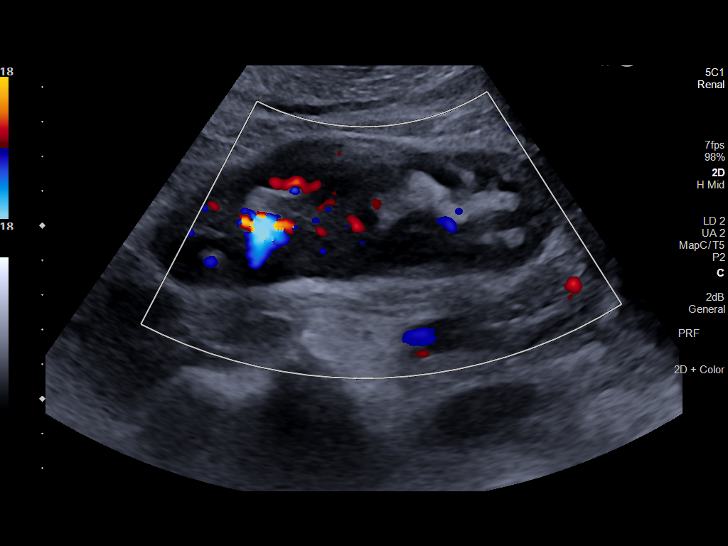
[im 33/53]
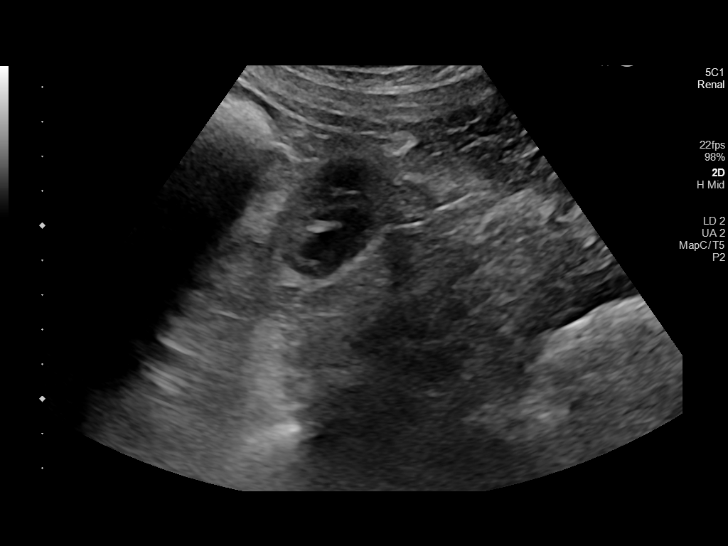
[im 35/53]
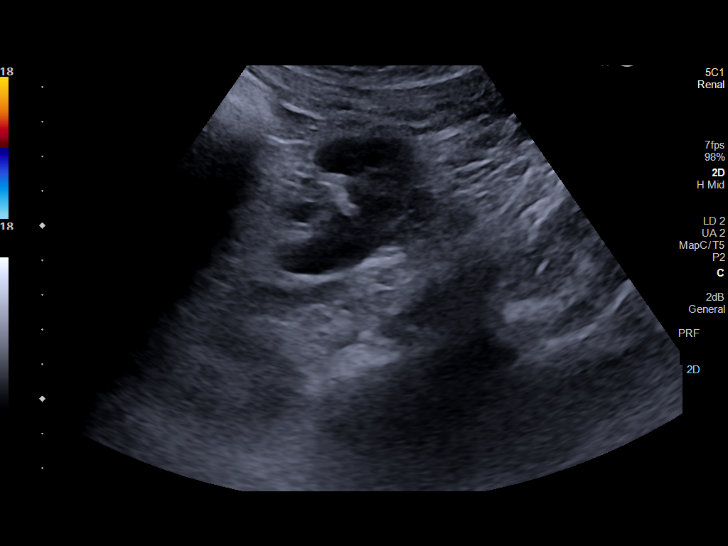
[im 40/53]
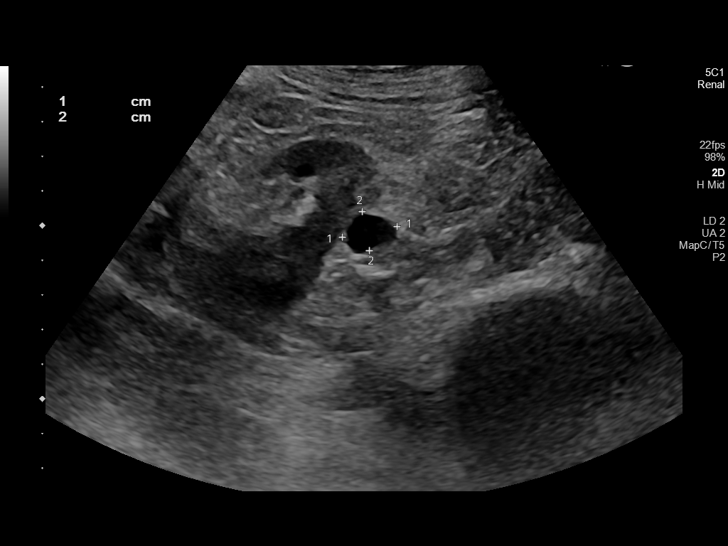
[im 44/53]
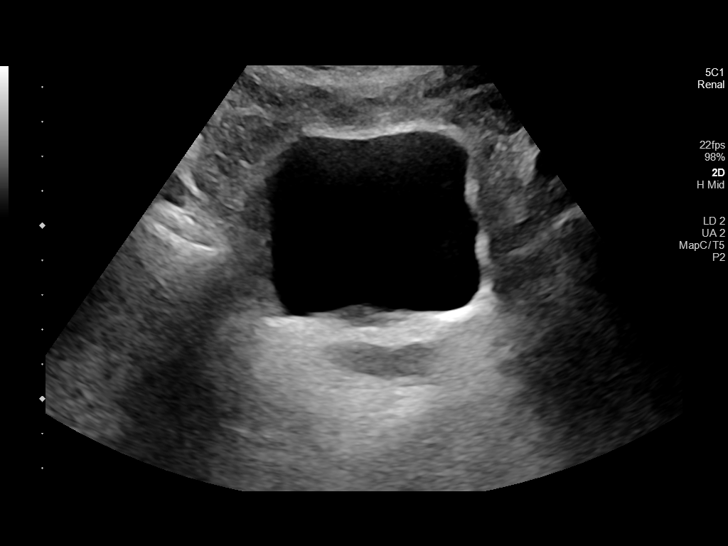
[im 48/53]
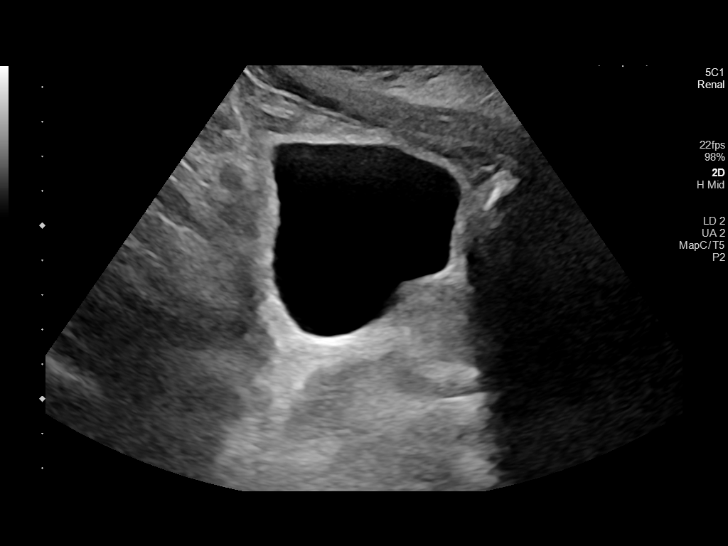
[im 53/53]
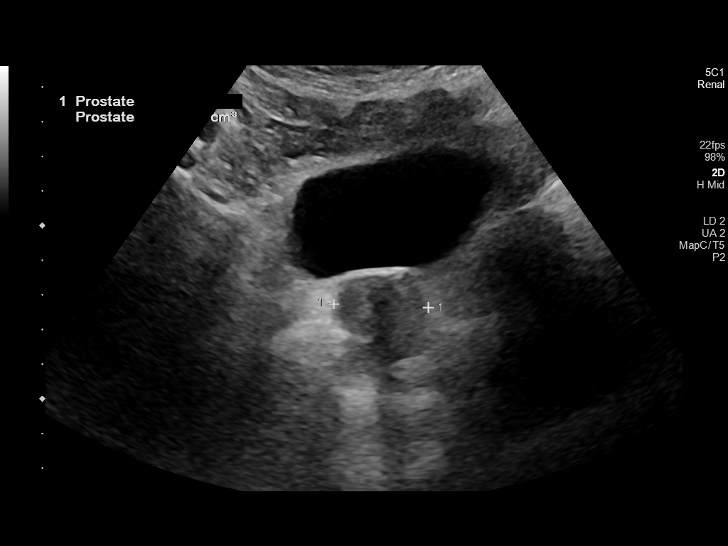

[14 of 25 positions shown; findings below may reference images not displayed]

FINDINGS: Right Kidney:

Renal measurements: 11.7 x 5.7 x 5.4 cm. = volume: 187 mL.
Echogenicity normal. 3.1 cm simple cyst. Tiny 2 mm nonobstructing
nonshadowing renal stone cannot be excluded. No hydronephrosis.

Left Kidney:

Renal measurements: 12.0 x 4.3 x 4.7 cm. = volume: 125 mL.
Echogenicity within normal limits. 1.3 x 1.2 x 1.6 cm simple cyst. 3
mm nonobstructing nonshadowing renal stone cannot be excluded. No
hydronephrosis visualized.

Bladder:Appears normal for degree of bladder distention.

Other: Prostate has a heterogeneous appearance and measures 3.6 x
3.3 x 2.7 cm with a volume of 17.3 mL.
IMPRESSION: 1. Bilateral simple renal cysts. A 2 mm right renal nonobstructing
renal stone cannot be excluded. A 3 mm nonobstructing left renal
stone cannot be excluded. No hydronephrosis.

2. Prostate has a heterogeneous appearance with maximum diameter
cm. No bladder distention.

## 2024-06-17 ENCOUNTER — Ambulatory Visit: Payer: Managed Care, Other (non HMO) | Admitting: Dermatology

## 2024-06-17 ENCOUNTER — Encounter: Payer: Self-pay | Admitting: Dermatology

## 2024-06-17 DIAGNOSIS — W908XXA Exposure to other nonionizing radiation, initial encounter: Secondary | ICD-10-CM

## 2024-06-17 DIAGNOSIS — L570 Actinic keratosis: Secondary | ICD-10-CM

## 2024-06-17 DIAGNOSIS — R21 Rash and other nonspecific skin eruption: Secondary | ICD-10-CM

## 2024-06-17 DIAGNOSIS — L814 Other melanin hyperpigmentation: Secondary | ICD-10-CM

## 2024-06-17 DIAGNOSIS — Z8589 Personal history of malignant neoplasm of other organs and systems: Secondary | ICD-10-CM

## 2024-06-17 DIAGNOSIS — L821 Other seborrheic keratosis: Secondary | ICD-10-CM

## 2024-06-17 DIAGNOSIS — D408 Neoplasm of uncertain behavior of other specified male genital organs: Secondary | ICD-10-CM

## 2024-06-17 DIAGNOSIS — Z7189 Other specified counseling: Secondary | ICD-10-CM

## 2024-06-17 DIAGNOSIS — L603 Nail dystrophy: Secondary | ICD-10-CM

## 2024-06-17 DIAGNOSIS — Z1283 Encounter for screening for malignant neoplasm of skin: Secondary | ICD-10-CM

## 2024-06-17 DIAGNOSIS — D492 Neoplasm of unspecified behavior of bone, soft tissue, and skin: Secondary | ICD-10-CM

## 2024-06-17 DIAGNOSIS — L578 Other skin changes due to chronic exposure to nonionizing radiation: Secondary | ICD-10-CM | POA: Diagnosis not present

## 2024-06-17 DIAGNOSIS — D229 Melanocytic nevi, unspecified: Secondary | ICD-10-CM

## 2024-06-17 DIAGNOSIS — L820 Inflamed seborrheic keratosis: Secondary | ICD-10-CM | POA: Diagnosis not present

## 2024-06-17 NOTE — Progress Notes (Unsigned)
 Follow-Up Visit   Subjective  Christopher Shannon is a 63 y.o. male who presents for the following: Skin Cancer Screening and Full Body Skin Exam, hx of SCCis  Check feet and nails long hx of trauma and rash on the feet for over 20 years   The patient presents for Total-Body Skin Exam (TBSE) for skin cancer screening and mole check. The patient has spots, moles and lesions to be evaluated, some may be new or changing and the patient may have concern these could be cancer.  The following portions of the chart were reviewed this encounter and updated as appropriate: medications, allergies, medical history  Review of Systems:  No other skin or systemic complaints except as noted in HPI or Assessment and Plan.  Objective  Well appearing patient in no apparent distress; mood and affect are within normal limits.  A full examination was performed including scalp, head, eyes, ears, nose, lips, neck, chest, axillae, abdomen, back, buttocks, bilateral upper extremities, bilateral lower extremities, hands, feet, fingers, toes, fingernails, and toenails. All findings within normal limits unless otherwise noted below.   Relevant physical exam findings are noted in the Assessment and Plan.  scalp x 2, dorsum  nose x1 (3) Erythematous thin papules/macules with gritty scale.  scalp, right temple (2) Stuck-on, waxy, tan-brown papules and plaques -- Discussed benign etiology and prognosis.  right top of shoulder Irregular brown macule                    Assessment & Plan   SKIN CANCER SCREENING PERFORMED TODAY.  ACTINIC DAMAGE - Chronic condition, secondary to cumulative UV/sun exposure - diffuse scaly erythematous macules with underlying dyspigmentation - Recommend daily broad spectrum sunscreen SPF 30+ to sun-exposed areas, reapply every 2 hours as needed.  - Staying in the shade or wearing long sleeves, sun glasses (UVA+UVB protection) and wide brim hats (4-inch brim around  the entire circumference of the hat) are also recommended for sun protection.  - Call for new or changing lesions.  LENTIGINES, SEBORRHEIC KERATOSES, HEMANGIOMAS - Benign normal skin lesions - Benign-appearing - Call for any changes  MELANOCYTIC NEVI - Tan-brown and/or pink-flesh-colored symmetric macules and papules - Benign appearing on exam today - Observation - Call clinic for new or changing moles - Recommend daily use of broad spectrum spf 30+ sunscreen to sun-exposed areas.   Blue nevus Left Forehead 0.4 x 0.3cm blue dark macule  Patient reports has been there over 30 years without changes.  Benign-appearing.  Observation.  Call clinic for new or changing lesions.  Recommend daily use of broad spectrum spf 30+ sunscreen to sun-exposed areas.    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  - Call if any new or changing lesions are noted between office visits    RASH  toenail dystrophy with hyperkeratosis of feet - soles No personal or family hx of psoriasis  Exam: Scaling and maceration web spaces and over distal and lateral soles. See photo  Chronic and persistent condition with duration or expected duration over one year. Condition is symptomatic / bothersome to patient. Not to goal. Treatment  Plan: Patient decline treatment  Discussed molecular study to R/O Tinea, patient decline      NAIL PROBLEM/TRAUMA Exam: discoloration right index fingernail and left thumb nail See photo Treatment Plan: Nail will eventually grow out  No treatment needed,     AK (ACTINIC KERATOSIS)  (3) scalp x 2, dorsum  nose x1 (3) Destruction of lesion - scalp x 2, dorsum  nose x1 (3) Complexity: simple   Destruction method: cryotherapy   Informed consent: discussed and consent obtained   Timeout:  patient name, date of birth, surgical site, and procedure verified Lesion destroyed using liquid nitrogen: Yes   Region frozen until ice ball extended beyond lesion: Yes   Outcome: patient tolerated procedure well with no complications   Post-procedure details: wound care instructions given    INFLAMED SEBORRHEIC KERATOSIS (2) scalp, right temple (2) Symptomatic, irritating, patient would like treated.  Destruction of lesion - scalp, right temple (2) Complexity: simple   Destruction method: cryotherapy   Informed consent: discussed and consent obtained   Timeout:  patient name, date of birth, surgical site, and procedure verified Lesion destroyed using liquid nitrogen: Yes   Region frozen until ice ball extended beyond lesion: Yes   Outcome: patient tolerated procedure well with no complications   Post-procedure details: wound care instructions given    NEOPLASM OF SKIN right top of shoulder Epidermal / dermal shaving  Lesion diameter (cm):  0.6 Informed consent: discussed and consent obtained   Timeout: patient name, date of birth, surgical site, and procedure verified   Procedure prep:  Patient was prepped and draped in usual sterile fashion Prep type:  Isopropyl alcohol Anesthesia: the lesion was anesthetized in a standard fashion   Anesthetic:  1% lidocaine  w/ epinephrine  1-100,000 buffered w/ 8.4% NaHCO3 Hemostasis achieved with: pressure, aluminum chloride and electrodesiccation   Outcome: patient tolerated procedure well   Post-procedure details: sterile dressing applied and wound care instructions given   Dressing type: bandage and petrolatum    Specimen 1 - Surgical pathology Differential Diagnosis: R/O Dysplastic nevus   Check Margins: No   Return in about 1  year (around 06/17/2025) for TBSE, hx of SCCis  .  IFay Kirks, CMA, am acting as scribe for Alm Rhyme, MD .   Documentation: I have reviewed the above documentation for accuracy and completeness, and I agree with the above.  Alm Rhyme, MD

## 2024-06-17 NOTE — Progress Notes (Signed)
 Cardiology Office Note  Date:  06/18/2024   ID:  Theoplis, Garciagarcia 1960/11/20, MRN [id number]  PCP:  Bertrum Charlie CROME, MD   Chief Complaint  Patient presents with   New Patient (Initial Visit)    Ref by Dr. Bertrum for dizziness, vertigo, chest pain and shortness of breath with exertion.     HPI:  Christopher Shannon a 63 y.o. malewith past medical history of: Recurrent UTI anxiety Family history heart disease Who presents by referral from Dr. Bertrum for consultation of his dizziness, diaphoresis  Previously seen in clinic 2/ 2019 Calcium score at that time was 0  Sept 1st, developed vertigo working under Acupuncturist sensation, nausea, improving by the next day Took 30 min to get out from under the car  Having other symptoms ---Pain in the chest: dull ache, can last more than a day,  ---Sleeping more past few years, wanting to sleep ---Pain in feet at night when sleeping when first laying down, aching, jolt every 10 sec, eventually goes away ---SOB after 3 flights, trouble talking after getting to the top of the steps ---Sweating with minimal exersion, such as performing activity on a roof ---Laying on side, Hears wooshing in ear ---When Coughing seems shallow compared to prior deep cough  Calcium score 2019 Score of 0  Lab Results  Component Value Date   CHOL 243 (H) 07/23/2021   HDL 60 07/23/2021   LDLCALC 165 (H) 07/23/2021   TRIG 101 07/23/2021     EKG personally reviewed by myself on todays visit EKG Interpretation Date/Time:  Friday June 18 2024 10:00:46 EDT Ventricular Rate:  67 PR Interval:  128 QRS Duration:  104 QT Interval:  376 QTC Calculation: 397 R Axis:   -19  Text Interpretation: Normal sinus rhythm When compared with ECG of 18-Aug-2019 20:41, No significant change was found Confirmed by Perla Lye [phone]) on 06/18/2024 10:17:35 AM   PMH:   has a past medical history of Anxiety (10/22/2017), Arm numbness (10/22/2017),  Arthritis, Benign prostatic hyperplasia without urinary obstruction (10/24/2006), Fatigue (10/22/2017), GERD (07/06/2010), GERD (gastroesophageal reflux disease), Hiatal hernia, History of squamous cell carcinoma in situ (SCCIS) (06/10/2022), Irritable bowel syndrome with constipation (01/04/2015), Joint pain, and Umbilical hernia without obstruction and without gangrene (04/15/2017).  PSH:    Past Surgical History:  Procedure Laterality Date   COLONOSCOPY  2011   INGUINAL HERNIA REPAIR Left 04/05/2020   Procedure: HERNIA REPAIR INGUINAL ADULT, open;  Surgeon: Marolyn Nest, MD;  Location: [facility] ORS;  Service: General;  Laterality: Left;   TONSILLECTOMY     AGE 81   UMBILICAL HERNIA REPAIR N/A 05/20/2018   Procedure: HERNIA REPAIR UMBILICAL ADULT;  Surgeon: Wonda Charlie BRAVO, MD;  Location: [facility] ORS;  Service: General;  Laterality: N/A;   UPPER GI ENDOSCOPY      Current Outpatient Medications  Medication Sig Dispense Refill   Coenzyme Q10 (COQ10) 200 MG CAPS Take 200 mg by mouth daily at 6 (six) AM.     No current facility-administered medications for this visit.     Allergies:   Patient has no known allergies.   Social History:  The patient  reports that he has never smoked. He has never used smokeless tobacco. He reports that he does not drink alcohol and does not use drugs.   Family History:   family history includes Arrhythmia in his father; Asthma in his son; Atrial fibrillation in his father; Heart disease in his father; Heart failure in his father; Lupus  in his mother; Stomach cancer in his mother; Thyroid disease in his mother.    Review of Systems: Review of Systems  Constitutional: Negative.   HENT: Negative.    Respiratory: Negative.    Cardiovascular:  Positive for chest pain.  Gastrointestinal: Negative.   Musculoskeletal: Negative.   Neurological: Negative.   Psychiatric/Behavioral: Negative.    All other systems reviewed and are negative.  PHYSICAL EXAM: VS:   BP 122/68 (BP Location: Right Arm, Patient Position: Sitting, Cuff Size: Normal)   Pulse 67   Ht 5' 10 (1.778 m)   Wt 198 lb 6 oz (90 kg)   SpO2 98%   BMI 28.46 kg/m  , BMI Body mass index is 28.46 kg/m. GEN: Well nourished, well developed, in no acute distress HEENT: normal Neck: no JVD, carotid bruits, or masses Cardiac: RRR; no murmurs, rubs, or gallops,no edema  Respiratory:  clear to auscultation bilaterally, normal work of breathing GI: soft, nontender, nondistended, + BS MS: no deformity or atrophy Skin: warm and dry, no rash Neuro:  Strength and sensation are intact Psych: euthymic mood, full affect  Recent Labs: No results found for requested labs within last 365 days.    Lipid Panel Lab Results  Component Value Date   CHOL 243 (H) 07/23/2021   HDL 60 07/23/2021   LDLCALC 165 (H) 07/23/2021   TRIG 101 07/23/2021      Wt Readings from Last 3 Encounters:  06/18/24 198 lb 6 oz (90 kg)  11/13/22 184 lb (83.5 kg)  04/04/22 180 lb (81.6 kg)       ASSESSMENT AND PLAN:  Problem List Items Addressed This Visit     Chest pain   Relevant Orders   EKG 12-Lead (Completed)   Other Visit Diagnoses       Dizziness    -  Primary   Relevant Orders   EKG 12-Lead (Completed)     Diaphoresis         SOB (shortness of breath) on exertion       Relevant Orders   EKG 12-Lead (Completed)      Chest pain Strong family history of cardiac disease His cholesterol is elevated, untreated Reports symptoms of chest discomfort and shortness of breath on exertion with occasional diaphoresis Symptoms concerning for angina Discussed various treatment options, we have suggested cardiac CTA for further evaluation -Will use results from the CTA to guide therapy, and management of hyperlipidemia  Foot pain Atypical in nature, normal pulses on exam Possibly neuropathic Could consider arch supports/orthotics  Whooshing in his ear Less likely vascular disease, likely  positional in nature  Sleeping more Less likely cardiac issue, Workup could include checking vitamin D, B12, testosterone, thyroid -Recommend regular exercise program  Vertigo Recent episode while working underneath a vehicle, seem to resolved without intervention, has meclizine as needed  Seen in consultation for Dr. Bertrum and will be referred back to his office for ongoing care of the issues detailed above  Signed, Velinda Lunger, M.D., Ph.D. [facility] [place], [place] [phone]

## 2024-06-17 NOTE — Patient Instructions (Addendum)

## 2024-06-18 ENCOUNTER — Ambulatory Visit: Attending: Cardiovascular Disease | Admitting: Cardiovascular Disease

## 2024-06-18 ENCOUNTER — Encounter: Payer: Self-pay | Admitting: Cardiovascular Disease

## 2024-06-18 VITALS — BP 122/68 | HR 67 | Ht 70.0 in | Wt 198.4 lb

## 2024-06-18 DIAGNOSIS — I209 Angina pectoris, unspecified: Secondary | ICD-10-CM

## 2024-06-18 DIAGNOSIS — R42 Dizziness and giddiness: Secondary | ICD-10-CM | POA: Diagnosis not present

## 2024-06-18 DIAGNOSIS — R079 Chest pain, unspecified: Secondary | ICD-10-CM | POA: Diagnosis not present

## 2024-06-18 DIAGNOSIS — R072 Precordial pain: Secondary | ICD-10-CM

## 2024-06-18 DIAGNOSIS — R61 Generalized hyperhidrosis: Secondary | ICD-10-CM

## 2024-06-18 DIAGNOSIS — R0602 Shortness of breath: Secondary | ICD-10-CM

## 2024-06-18 NOTE — Patient Instructions (Addendum)
 Medication Instructions:   No changes  Metoprolol Tartrate 100 MG once two hours prior to Cardiac CT.   If you need a refill on your cardiac medications before your next appointment, please call your pharmacy.   Lab work: Your provider would like for you to return prior to your Cardiac CT to have the following labs drawn: BMP.   Please go to [facility] [address] ([facility] [address], [place] [address] You do not need an appointment.  They are open from 8 am- 4:30 pm.  Lunch from 1:00 pm- 2:00 pm You will not need to be fasting.   Testing/Procedures:   Your cardiac CT will be scheduled at one of the below locations:    [facility] [address] [place], [place] [address] [phone]  Please follow these instructions carefully (unless otherwise directed):  An IV will be required for this test and Nitroglycerin will be given.  Hold all erectile dysfunction medications at least 3 days (72 hrs) prior to test. (Ie viagra, cialis, sildenafil, tadalafil, etc)   On the Night Before the Test: Be sure to Drink plenty of water. Do not consume any caffeinated/decaffeinated beverages or chocolate 12 hours prior to your test. Do not take any antihistamines 12 hours prior to your test.   On the Day of the Test: Drink plenty of water until 1 hour prior to the test. Do not eat any food 1 hour prior to test. You may take your regular medications prior to the test.  Take metoprolol (Lopressor) 100 MG two hours prior to test. If you take Furosemide/Hydrochlorothiazide/Spironolactone/Chlorthalidone, please HOLD on the morning of the test. Patients who wear a continuous glucose monitor MUST remove the device prior to scanning. FEMALES- please wear underwire-free bra if available, avoid dresses & tight clothing      After the Test: Drink plenty of water. After receiving IV contrast, you may experience a mild flushed feeling. This is  normal. On occasion, you may experience a mild rash up to 24 hours after the test. This is not dangerous. If this occurs, you can take Benadryl 25 mg, Zyrtec, Claritin, or Allegra and increase your fluid intake. (Patients taking Tikosyn should avoid Benadryl, and may take Zyrtec, Claritin, or Allegra) If you experience trouble breathing, this can be serious. If it is severe call 911 IMMEDIATELY. If it is mild, please call our office.  We will call to schedule your test 2-4 weeks out understanding that some insurance companies will need an authorization prior to the service being performed.   For more information and frequently asked questions, please visit our website : [email or web address]  For non-scheduling related questions, please contact the cardiac imaging nurse navigator should you have any questions/concerns: Cardiac Imaging Nurse Navigators Direct Office Dial: [phone]   For scheduling needs, including cancellations and rescheduling, please call [place], [phone].   Follow-Up: At [facility], you and your health needs are our priority.  As part of our continuing mission to provide you with exceptional heart care, we have created designated Provider Care Teams.  These Care Teams include your primary Cardiologist (physician) and Advanced Practice Providers (APPs -  Physician Assistants and Nurse Practitioners) who all work together to provide you with the care you need, when you need it.  You will need a follow up appointment as needed  Providers on your designated Care Team:   Lonni Meager, NP Bernardino Bring, PA-C Cadence Franchester, PA-C  COVID-19 Vaccine Information can be found at:  [email or web address] For questions related to vaccine distribution or appointments, please email [email or web address] or call [phone].

## 2024-06-21 ENCOUNTER — Ambulatory Visit: Payer: Self-pay | Admitting: Dermatology

## 2024-06-21 NOTE — Telephone Encounter (Addendum)
 Called and discussed bx result with patient. He verbalized understanding and denied further questions. ----- Message from Christopher Shannon sent at 06/21/2024  4:55 PM EDT ----- FINAL DIAGNOSIS        1. Skin, right top of shoulder :       SEBORRHEIC KERATOSIS, IRRITATED   Benign irritated keratosis No further treatment needed ----- Message ----- From: Interface, Lab In Three Zero One Sent: 06/21/2024   4:23 PM EDT To: Christopher JAYSON Rhyme, MD

## 2024-06-22 LAB — SURGICAL PATHOLOGY

## 2024-07-06 ENCOUNTER — Ambulatory Visit: Payer: Self-pay | Admitting: Cardiovascular Disease

## 2024-07-06 LAB — BASIC METABOLIC PANEL WITH GFR
BUN/Creatinine Ratio: 13 (ref 10–24)
BUN: 15 mg/dL (ref 8–27)
CO2: 24 mmol/L (ref 20–29)
Calcium: 9.5 mg/dL (ref 8.6–10.2)
Chloride: 100 mmol/L (ref 96–106)
Creatinine, Ser: 1.12 mg/dL (ref 0.76–1.27)
Glucose: 91 mg/dL (ref 70–99)
Potassium: 4.4 mmol/L (ref 3.5–5.2)
Sodium: 139 mmol/L (ref 134–144)
eGFR: 73 mL/min/1.73 (ref >59)

## 2024-07-09 ENCOUNTER — Encounter (HOSPITAL_COMMUNITY): Payer: Self-pay

## 2024-07-09 ENCOUNTER — Telehealth (HOSPITAL_COMMUNITY): Payer: Self-pay | Admitting: Emergency Medicine

## 2024-07-09 NOTE — Telephone Encounter (Signed)
 Unable to leave vm [place]well [place] RN Navigator Cardiac Imaging [facility] [phone] Office  [phone] Cell

## 2024-07-12 ENCOUNTER — Ambulatory Visit
Admission: RE | Admit: 2024-07-12 | Discharge: 2024-07-12 | Disposition: A | Discharge status: Home / Self Care | Source: Ambulatory Visit | Attending: Cardiovascular Disease | Admitting: Cardiovascular Disease

## 2024-07-12 DIAGNOSIS — R61 Generalized hyperhidrosis: Secondary | ICD-10-CM | POA: Insufficient documentation

## 2024-07-12 DIAGNOSIS — R072 Precordial pain: Secondary | ICD-10-CM | POA: Insufficient documentation

## 2024-07-12 DIAGNOSIS — I209 Angina pectoris, unspecified: Secondary | ICD-10-CM | POA: Insufficient documentation

## 2024-07-12 DIAGNOSIS — R0602 Shortness of breath: Secondary | ICD-10-CM | POA: Insufficient documentation

## 2024-07-12 DIAGNOSIS — R079 Chest pain, unspecified: Secondary | ICD-10-CM | POA: Insufficient documentation

## 2024-07-12 MED ORDER — IOHEXOL 350 MG/ML SOLN
100.0000 mL | Freq: Once | INTRAVENOUS | Status: AC | PRN
  Administered 2024-07-12: 100 mL via INTRAVENOUS

## 2024-07-12 MED ORDER — METOPROLOL TARTRATE 5 MG/5ML IV SOLN: 10.0000 mg | Freq: Once | INTRAVENOUS | Status: DC | PRN

## 2024-07-12 MED ORDER — DILTIAZEM HCL 25 MG/5ML IV SOLN: 10.0000 mg | INTRAVENOUS | Status: DC | PRN

## 2024-07-12 MED ORDER — NITROGLYCERIN 0.4 MG SL SUBL
0.8000 mg | SUBLINGUAL_TABLET | Freq: Once | SUBLINGUAL | Status: AC
  Administered 2024-07-12: 0.8 mg via SUBLINGUAL
  Filled 2024-07-12: qty 25

## 2024-07-16 ENCOUNTER — Telehealth: Payer: Self-pay | Admitting: Cardiovascular Disease

## 2024-07-16 NOTE — Telephone Encounter (Signed)
 Called and left detailed message per DPR. Left the following from Dr. Gollan.  Cardiac CTA Calcium score 0, no significant stenosis Waiting on radiology over read Overall looks good  Asked patient to call back with any questions.

## 2024-07-16 NOTE — Telephone Encounter (Signed)
 Pt returned call and would like a c/b on  [phone] [facility])

## 2024-07-16 NOTE — Telephone Encounter (Signed)
 Left a message for the patient to call back.

## 2024-07-16 NOTE — Telephone Encounter (Signed)
 Pt calling to get info about CT test results. Please advise.

## 2024-07-20 NOTE — Telephone Encounter (Signed)
 Called patient unable to leave message

## 2024-07-22 NOTE — Telephone Encounter (Signed)
 Please see mychart encounter

## 2024-07-23 ENCOUNTER — Other Ambulatory Visit: Payer: Self-pay | Admitting: Emergency Medicine

## 2024-07-23 DIAGNOSIS — R0602 Shortness of breath: Secondary | ICD-10-CM

## 2024-08-17 ENCOUNTER — Ambulatory Visit: Attending: Cardiovascular Disease

## 2024-08-17 DIAGNOSIS — R0602 Shortness of breath: Secondary | ICD-10-CM

## 2024-08-17 LAB — ECHOCARDIOGRAM COMPLETE
AR max vel: 2.94 cm2
AV Area VTI: 3.01 cm2
AV Area mean vel: 2.94 cm2
AV Mean grad: 3 mmHg
AV Peak grad: 5.8 mmHg
Ao pk vel: 1.2 m/s
Area-P 1/2: 4.06 cm2
S' Lateral: 2.76 cm

## 2024-08-29 ENCOUNTER — Ambulatory Visit: Payer: Self-pay | Admitting: Cardiovascular Disease

## 2024-09-01 ENCOUNTER — Ambulatory Visit

## 2025-06-23 ENCOUNTER — Ambulatory Visit: Admitting: Dermatology
